# Patient Record
Sex: Male | Born: 1975 | Hispanic: Yes | Marital: Married | Smoking: Smoker, current status unknown
Health system: Southern US, Community
[De-identification: ages and names within clinical notes are randomized; demographics above are authoritative.]

## PROBLEM LIST (undated history)

## (undated) DIAGNOSIS — I209 Angina pectoris, unspecified: Secondary | ICD-10-CM

## (undated) DIAGNOSIS — R0600 Dyspnea, unspecified: Secondary | ICD-10-CM

## (undated) DIAGNOSIS — R569 Unspecified convulsions: Secondary | ICD-10-CM

## (undated) DIAGNOSIS — K219 Gastro-esophageal reflux disease without esophagitis: Secondary | ICD-10-CM

## (undated) HISTORY — PX: OTHER SURGICAL HISTORY: SHX169

---

## 2007-12-18 ENCOUNTER — Emergency Department: Payer: Self-pay | Admitting: Emergency Medicine

## 2015-04-27 ENCOUNTER — Emergency Department
Admission: EM | Admit: 2015-04-27 | Discharge: 2015-04-27 | Disposition: A | Discharge status: Home / Self Care | Payer: Self-pay | Attending: Emergency Medicine | Admitting: Emergency Medicine

## 2015-04-27 ENCOUNTER — Encounter: Payer: Self-pay | Admitting: Emergency Medicine

## 2015-04-27 DIAGNOSIS — Z720 Tobacco use: Secondary | ICD-10-CM | POA: Insufficient documentation

## 2015-04-27 DIAGNOSIS — M545 Low back pain, unspecified: Secondary | ICD-10-CM

## 2015-04-27 MED ORDER — PREDNISONE 20 MG PO TABS: 40.0000 mg | ORAL_TABLET | Freq: Every day | ORAL | Status: AC

## 2015-04-27 MED ORDER — CYCLOBENZAPRINE HCL 5 MG PO TABS: 5.0000 mg | ORAL_TABLET | Freq: Three times a day (TID) | ORAL | Status: AC | PRN

## 2015-04-27 NOTE — ED Notes (Signed)
Pt arrives with complaints of lower back pain, states he had an accident at work 1 month ago and now lower back pain is increasing, pt states he did not seek evaluation at initial injury, pt states increasing pain with activity,denies any numbness or weakness in legs  interpeter Maryjane Hurter at bedside

## 2015-04-27 NOTE — ED Notes (Signed)
Reports injuring back at work last week

## 2015-04-27 NOTE — ED Provider Notes (Signed)
[facility] Emergency Department Provider Note   ____________________________________________  Time seen: 1640  I have reviewed the triage vital signs and the nursing notes.   HISTORY  Chief Complaint Back Pain   History limited by: Language [facility] Interpreter utilized   HPI Trevor Schultz Ashley Jacobs is a 39 y.o. male who presents to the emergency department today with concerns for back pain. Patient states that he has had this pain for roughly 1 month. He states pain started whilst he was at work. He was moving a heavy panel when he torqued his body to one side. He states he started having pain at that time. He is trying to manage at home. Has not seen anybody for this pain since then. He describes it as being located in his lower back. It does radiate bilaterally to the sides. He denies any numbness or change in sensation or strength of his legs. He denies any change in bladder or bowel function. Denies any fevers.   History reviewed. No pertinent past medical history.  There are no active problems to display for this patient.   History reviewed. No pertinent past surgical history.  No current outpatient prescriptions on file.  Allergies Review of patient's allergies indicates no known allergies.  History reviewed. No pertinent family history.  Social History Social History  Substance Use Topics  . Smoking status: Smoker, Current Status Unknown  . Smokeless tobacco: None  . Alcohol Use: No    Review of Systems  Constitutional: Negative for fever. Cardiovascular: Negative for chest pain. Respiratory: Negative for shortness of breath. Gastrointestinal: Negative for abdominal pain, vomiting and diarrhea. Genitourinary: Negative for dysuria. Musculoskeletal: Positive for back pain. Neurological: Negative for headaches, focal weakness or numbness.   10-point ROS otherwise  negative.  ____________________________________________   PHYSICAL EXAM:  VITAL SIGNS: ED Triage Vitals  Enc Vitals Group     BP 04/27/15 1551 143/84 mmHg     Pulse Rate 04/27/15 1551 78     Resp 04/27/15 1551 20     Temp 04/27/15 1551 98 F (36.7 C)     Temp Source 04/27/15 1551 Oral     SpO2 04/27/15 1551 97 %     Weight 04/27/15 1551 198 lb (89.812 kg)     Height 04/27/15 1551 5\' 2"  (1.575 m)     Head Cir --      Peak Flow --      Pain Score 04/27/15 1554 9   Constitutional: Alert and oriented. Well appearing and in no distress. Eyes: Conjunctivae are normal. PERRL. Normal extraocular movements. ENT   Head: Normocephalic and atraumatic.   Nose: No congestion/rhinnorhea.   Mouth/Throat: Mucous membranes are moist.   Neck: No stridor. Hematological/Lymphatic/Immunilogical: No cervical lymphadenopathy. Cardiovascular: Normal rate, regular rhythm.  No murmurs, rubs, or gallops. Respiratory: Normal respiratory effort without tachypnea nor retractions. Breath sounds are clear and equal bilaterally. No wheezes/rales/rhonchi. Gastrointestinal: Soft and nontender. No distention. There is no CVA tenderness. Genitourinary: Deferred Musculoskeletal: Mildly tender to palpation along the lower back. Straight leg test positive bilaterally. Neurologic:  Normal speech and language. No gross focal neurologic deficits are appreciated. Speech is normal.  Skin:  Skin is warm, dry and intact. No rash noted. Psychiatric: Mood and affect are normal. Speech and behavior are normal. Patient exhibits appropriate insight and judgment.  ____________________________________________    LABS (pertinent positives/negatives)  None  ____________________________________________   EKG  None  ____________________________________________    RADIOLOGY  None  ____________________________________________  PROCEDURES  Procedure(s) performed: None  Critical Care performed:  No  ____________________________________________   INITIAL IMPRESSION / ASSESSMENT AND PLAN / ED COURSE  Pertinent labs & imaging results that were available during my care of the patient were reviewed by me and considered in my medical decision making (see chart for details).  Patient presents to the emergency department today with concerns for low back pain. On exam patient did have very mild tenderness to palpation of the lumbar spine. Patient did have positive straight leg test. No bladder or bowel changes. No other concerning findings for cord compression. At this point I do not feel that patient needs or warrants emergent radiographic imaging. Will plan on treating conservatively for low back pain.  ____________________________________________   FINAL CLINICAL IMPRESSION(S) / ED DIAGNOSES  Final diagnoses:  Midline low back pain without sciatica     Phineas Semen, MD 04/27/15 1711

## 2015-04-27 NOTE — Discharge Instructions (Signed)
Please seek medical attention for any high fevers, chest pain, shortness of breath, change in behavior, persistent vomiting, bloody stool or any other new or concerning symptoms.   Ejercicios para la espalda (Back Exercises) Estos ejercicios ayudan a tratar y prevenir lesiones en la espalda. El objetivo es aumentar la fuerza de los msculos abdominales y dorsales y la flexibilidad de la espalda. Debe comenzar con estos ejercicios cuando ya no tenga dolor. Los ejercicios para la espalda incluyen:  Inclinacin de la pelvis - Recustese sobre la espalda con las rodillas flexionadas. Incline la pelvis hasta que la parte inferior de la espalda se apoye en el piso. Mantenga esta posicin durante 5 a 10 segundos y repita entre 5 y 34 veces.  Rodilla al pecho - Empuje primero una rodilla contra el pecho y Harvey 20 a 30 segundos; repita con la otra rodilla y luego con ambas a la vez. Esto puede realizarlo con la otra pierna extendida o flexionada, del modo en que se sienta ms cmodo.  Abdominales o despegar el cccix del suelo empleando la musculatura abdominal - Shepherd 90 grados. Comience inclinando la pelvis y realice un ejercicio abdominal lento y parcial, elevando el tronco slo entre 61 y 48 grados del suelo. Emplee al [employer] 2 y 3 segundos para cada abdominal. No realice los abdominales con las rodillas extendidas. Si le resulta difcil realizar abdominales parciales, simplemente haga lo que se explic anteriormente, pero slo contraiga los msculos abdominales y Civil engineer, contracting tal como se le ha indicado.  Inclinacin de la cadera - Recustese sobre la espalda con las rodillas flexionadas a 90 grados. Empjese con los pies y los hombros mientras eleva la cadera un par de centmetros del suelo, [place] durante 10 segundos y repita entre 5 y 10 veces.  Arcos dorsales - Acustese sobre el [place] e impulse el tronco hacia atrs sobre los codos flexionados. Presione lentamente  con las manos, formando un arco con la zona inferior de la espalda. Repita entre 3 y 5 veces. Al realizar las repeticiones, luego de un tiempo disminuirn la rigidez y las [place].  Elevacin de los hombros - Acustese hacia abajo con los brazos a los lados del cuerpo. Braggs y Photographer torso contra el suelo mientras eleva lentamente la cabeza y los hombros del suelo. No exagere con los ejercicios, especialmente en el comienzo. Los ejercicios pueden causar alguna molestia leve en la espalda durante algunos minutos; sin embargo, si el dolor es muy intenso, o dura ms de 15 minutos, no siga con la actividad fsica hasta que consulte al profesional que lo asiste. Los problemas en la espalda mejoran de [place] lenta con esta terapia.  Consulte al profesional para que lo ayude a planificar un programa de ejercicios adecuado para su espalda. Document Released: 08/21/2005 Document Revised: 11/13/2011 [facility] Patient Information 2015 [place]. This information is not intended to replace advice given to you by your health care provider. Make sure you discuss any questions you have with your health care provider.  Prevencin de las lesiones en la espalda (Back Injury Prevention) Las lesiones en la espalda pueden ser muy dolorosas y son difciles de curar. Despus de tener una lesin en la espalda es probable que sufra otra en el futuro. Es importante aprender a Air traffic controller o a Location manager a Engineer, maintenance (IT). Los siguientes consejos pueden ayudar a prevenir una lesin de espalda.  APTITUD FSICA   Practique actividad fsica con frecuencia y trate de lograr un buen  tono en los msculos abdominales. Los msculos del abdomen nos proporcionan el soporte necesario para la espalda.  Haga ejercicios aerbicos (caminar, trotar, andar en bicicleta, nadar) con frecuencia.  Haga ejercicios que aumenten el equilibrio y la fuerza (tai chi, yoga) con frecuencia. Esto puede disminuir el riesgo de  caerse y Building surveyor espalda.  Elongue antes y despus de hacer [place].  Mantenga un peso saludable. Cuanto ms pese, ms tensin se pone en la espalda. Por cada libra de [place], es 10 veces mayor la cantidad de presin que se coloca en la parte posterior. DIETA   Consulte con su mdico la cantidad de calcio y vitamina D que necesita por da Estos nutrientes ayudan a prevenir el debilitamiento de los huesos (osteoporosis). La osteoporosis puede quebrar los huesos (fractura) que hacen doler la espalda.  Agregue una buena fuente de calcio en la dieta, como productos lcteos, vegetales de hojas verdes y productos con calcio agregado (fortificados).  Agregue una buena fuente de vitamina D en su dieta, como la [place] y alimentos que estn fortificados con vitamina D.  Consulte por un suplemento nutricional o un multivitamnico, si es necesario.  Si fuma, abandone el hbito. POSTURA   Sintese y pngase de pie en posicin recta. Evite inclinarse hacia adelante al sentarse o encorvarse mientras est de pie.  Elija sillas con buen apoyo para la espalda (lumbar).  Si trabaja en un escritorio, sintese cerca del mismo, de modo que no deba inclinarse hacia adelante. Mantenga el [employer]. El cuello debe estar hacia atrs y los codos doblados en ngulo recto. Los brazos deben formar la letra "L".  Sintese derecho y cerca del volante cuando conduzca su automvil. Coloque un soporte lumbar en el asiento de su automvil, si lo necesita.  Evite permanecer sentado o de pie en la misma posicin por [employer]. Descanse, levntese, estrese y camine al menos una vez cada hora. Tome descansos si conduce el automvil por [employer].  Duerma sobre un costado con las rodillas ligeramente dobladas o sobre la espalda con una almohada debajo de las rodillas. No duerma sobre el abdomen. LEVANTAR, DOBLARSE Y ALCANZAR   Evite levantar objetos pesados, especialmente si debe repetir los movimientos.  Si tiene que levantar un objeto pesado:  Haga elongaciones antes de Lexicographer un objeto.  Trabaje lentamente.  Descanse entre uno y otro esfuerzo.  Use carretas y carretillas para mover objetos siempre que pueda.  Haga varios viajes pequeos en vez de llevar una carga pesada.  Pida ayuda cuando la necesite.  Pida ayuda al mover objetos grandes y que sean difciles de [place].  Siga estos pasos al levantar objetos:  Prese con los pies separados a la misma distancia que el ancho de los hombros.  Mantngase lo ms cerca que pueda del [place]. No intente levantar objetos pesados que se encuentren lejos de su cuerpo.  Use los mangos o las correas de elevacin, si estn disponibles.  Doble las rodillas. Pngase de cuclillas, pero mantenga los [employer].  Mantenga los hombros hacia atrs, el mentn pegado, y la espalda recta.  Levante el objeto lentamente, tensando los msculos de las piernas, el abdomen y las nalgas. Mantenga el objeto lo ms cerca del centro de su cuerpo como sea posible.  Cuando baje la carga, use las [employer], pero al revs.  NO:  Levante el objeto por arriba de la cintura.  Gire la cintura mientras levanta o sostiene una carga pesada. Si necesita dar vuelta mueva sus  pies, no su cintura.  Inclnese hacia adelante sin flexionar las rodillas.  Evite levantar los objetos por arriba de su cabeza, por encima de una mesa o aquellos objetos que se encuentren sobre una superficie elevada. OTROS CONSEJOS   Evite pisar [employer] Peps[employer] mojados y [place] las aceras libres de hielo para evitar cadas.  No duerma sobre un colchn muy blando o muy duro.  Mantenga a su alcance los artculos que utiliza con frecuencia.  Coloque los objetos pesados en estantes a nivel de la cintura y los objetos ms livianos en estantes ms altos o ms bajos.  Encuentre formas para reducir Dealer, como el ejercicio, los masajes o las tcnicas de Systems developer. El  estrs puede [employer]. Los msculos tensionados son ms vulnerables a las lesiones.  Busque tratamientos para la depresin o la ansiedad si lo necesita. Estos trastornos pueden aumentar el riesgo de Hydrologist de [place]. SOLICITE ATENCIN MDICA SI:   Se lesiona la espalda.  Tiene preguntas sobre la dieta, el ejercicio u otras formas de prevenir las lesiones en la espalda. ASEGRESE DE QUE:   Comprende estas instrucciones.  Controlar su enfermedad.  Solicitar ayuda de inmediato si no mejora o si empeora. Document Released: 08/21/2005 Document Revised: 11/13/2011 [facility] Patient Information 2015 [place]. This information is not intended to replace advice given to you by your health care provider. Make sure you discuss any questions you have with your health care provider.

## 2016-07-06 DIAGNOSIS — K210 Gastro-esophageal reflux disease with esophagitis: Secondary | ICD-10-CM | POA: Insufficient documentation

## 2016-07-06 DIAGNOSIS — F17200 Nicotine dependence, unspecified, uncomplicated: Secondary | ICD-10-CM | POA: Insufficient documentation

## 2016-07-06 NOTE — ED Triage Notes (Signed)
Pt in with co chest pain for a few days no hx of the same. No recent illness or injury.

## 2016-07-07 ENCOUNTER — Emergency Department
Admission: EM | Admit: 2016-07-07 | Discharge: 2016-07-07 | Disposition: A | Discharge status: Home / Self Care | Payer: Self-pay | Attending: Emergency Medicine | Admitting: Emergency Medicine

## 2016-07-07 ENCOUNTER — Emergency Department: Payer: Self-pay

## 2016-07-07 DIAGNOSIS — K2100 Gastro-esophageal reflux disease with esophagitis, without bleeding: Secondary | ICD-10-CM

## 2016-07-07 DIAGNOSIS — R1011 Right upper quadrant pain: Secondary | ICD-10-CM

## 2016-07-07 LAB — COMPREHENSIVE METABOLIC PANEL
ALBUMIN: 4.2 g/dL (ref 3.5–5.0)
ALT: 47 U/L (ref 17–63)
AST: 69 U/L — AB (ref 15–41)
Alkaline Phosphatase: 75 U/L (ref 38–126)
Anion gap: 7 (ref 5–15)
BUN: 16 mg/dL (ref 6–20)
CHLORIDE: 104 mmol/L (ref 101–111)
CO2: 22 mmol/L (ref 22–32)
CREATININE: 0.81 mg/dL (ref 0.61–1.24)
Calcium: 8.8 mg/dL — ABNORMAL LOW (ref 8.9–10.3)
GFR calc Af Amer: >60 mL/min (ref >60)
GFR calc non Af Amer: >60 mL/min (ref >60)
Glucose, Bld: 121 mg/dL — ABNORMAL HIGH (ref 65–99)
POTASSIUM: 5.4 mmol/L — AB (ref 3.5–5.1)
SODIUM: 133 mmol/L — AB (ref 135–145)
Total Bilirubin: 1.6 mg/dL — ABNORMAL HIGH (ref 0.3–1.2)
Total Protein: 6.2 g/dL — ABNORMAL LOW (ref 6.5–8.1)

## 2016-07-07 LAB — CBC
HCT: 40.3 % (ref 40.0–52.0)
Hemoglobin: 13 g/dL (ref 13.0–18.0)
MCH: 27.9 pg (ref 26.0–34.0)
MCHC: 32.3 g/dL (ref 32.0–36.0)
MCV: 86.3 fL (ref 80.0–100.0)
PLATELETS: 317 10*3/uL (ref 150–440)
RBC: 4.67 MIL/uL (ref 4.40–5.90)
RDW: 14.6 % — AB (ref 11.5–14.5)
WBC: 9.7 10*3/uL (ref 3.8–10.6)

## 2016-07-07 LAB — TROPONIN I: Troponin I: <0.03 ng/mL (ref <0.03)

## 2016-07-07 MED ORDER — PANTOPRAZOLE SODIUM 40 MG PO TBEC
40.0000 mg | DELAYED_RELEASE_TABLET | Freq: Once | ORAL | Status: AC
  Administered 2016-07-07: 40 mg via ORAL
  Filled 2016-07-07: qty 1

## 2016-07-07 MED ORDER — PANTOPRAZOLE SODIUM 40 MG PO TBEC: 40.0000 mg | DELAYED_RELEASE_TABLET | Freq: Every day | ORAL | 0 refills | Status: AC

## 2016-07-07 NOTE — ED Notes (Signed)
D/C done with interpreter Steffany [phone]#[id number].

## 2016-07-07 NOTE — ED Provider Notes (Signed)
[facility] Emergency Department Provider Note    First MD Initiated Contact with Patient 07/07/16 0130     (approximate)  I have reviewed the triage vital signs and the nursing notes.   HISTORY  Chief Complaint Chest Pain    HPI Lavonda JumboMarvin Flores Reyes is a 40 y.o. male presents with complaint of epigastric/chest pain 2 years intermittently. Patient describes the pain as burning. Patient denies any dyspnea no nausea or vomiting   Past medical history None There are no active problems to display for this patient.   Past Surgical history None  Prior to Admission medications   Not on File    Allergies No known drug allergies No family history on file.  Social History Social History  Substance Use Topics  . Smoking status: Smoker, Current Status Unknown  . Smokeless tobacco: Not on file  . Alcohol use No    Review of Systems Constitutional: No fever/chills Eyes: No visual changes. ENT: No sore throat. Cardiovascular: Positive for chest pain. Respiratory: Denies shortness of breath. Gastrointestinal: No abdominal pain.  No nausea, no vomiting.  No diarrhea.  No constipation. Genitourinary: Negative for dysuria. Musculoskeletal: Negative for back pain. Skin: Negative for rash. Neurological: Negative for headaches, focal weakness or numbness.  10-point ROS otherwise negative.  ____________________________________________   PHYSICAL EXAM:  VITAL SIGNS: ED Triage Vitals  Enc Vitals Group     BP 07/06/16 2328 139/89     Pulse Rate 07/06/16 2328 87     Resp 07/06/16 2328 18     Temp 07/06/16 2328 98 F (36.7 C)     Temp Source 07/06/16 2328 Oral     SpO2 07/06/16 2328 96 %     Weight 07/06/16 2325 198 lb (89.8 kg)     Height 07/06/16 2325 5\' 6"  (1.676 m)     Head Circumference --      Peak Flow --      Pain Score 07/06/16 2326 9     Pain Loc --      Pain Edu? --      Excl. in GC? --     Constitutional: Alert and oriented.  Well appearing and in no acute distress. Eyes: Conjunctivae are normal. PERRL. EOMI. Head: Atraumatic. Mouth/Throat: Mucous membranes are moist.  Oropharynx non-erythematous. Neck: No stridor.  No meningeal signs.  No cervical spine tenderness to palpation. Cardiovascular: Normal rate, regular rhythm. Good peripheral circulation. Grossly normal heart sounds. Respiratory: Normal respiratory effort.  No retractions. Lungs CTAB. Gastrointestinal: Soft and nontender. No distention.  Musculoskeletal: No lower extremity tenderness nor edema. No gross deformities of extremities. Neurologic:  Normal speech and language. No gross focal neurologic deficits are appreciated.  Skin:  Skin is warm, dry and intact. No rash noted. Psychiatric: Mood and affect are normal. Speech and behavior are normal.  ____________________________________________   LABS (all labs ordered are listed, but only abnormal results are displayed)  Labs Reviewed  CBC - Abnormal; Notable for the following:       Result Value   RDW 14.6 (*)    All other components within normal limits  COMPREHENSIVE METABOLIC PANEL - Abnormal; Notable for the following:    Sodium 133 (*)    Potassium 5.4 (*)    Glucose, Bld 121 (*)    Calcium 8.8 (*)    Total Protein 6.2 (*)    AST 69 (*)    Total Bilirubin 1.6 (*)    All other components within normal limits  TROPONIN  I   ____________________________________________  EKG  ED ECG REPORT I, [place] N Cristin Penaflor, the attending physician, personally viewed and interpreted this ECG.   Date: 07/07/2016  EKG Time: 11:25 PM  Rate: 76  Rhythm: Normal sinus rhythm  Axis: Normal  Intervals: Normal  ST&T Change: None  ____________________________________________  RADIOLOGY I, [place] N Nyala Kirchner, personally viewed and evaluated these images (plain radiographs) as part of my medical decision making, as well as reviewing the written report by the radiologist.  Dg Chest 2 View  Addendum  Date: 07/07/2016   ADDENDUM REPORT: 07/07/2016 03:18 ADDENDUM: The prominent calcified density overlying the left lung base most likely reflects a large calcified granuloma. Alternatively, if it resides within the left ventricle, it could reflect calcification near the mitral annulus. Regardless, it has a benign appearance. Electronically Signed   By: Roanna RaiderJeffery  Chang M.D.   On: 07/07/2016 03:18   Result Date: 07/07/2016 CLINICAL DATA:  Acute onset of generalized chest pain. Initial encounter. EXAM: CHEST  2 VIEW COMPARISON:  None. FINDINGS: The lungs are well-aerated and clear. There is no evidence of focal opacification, pleural effusion or pneumothorax. The heart is borderline enlarged. No acute osseous abnormalities are seen. IMPRESSION: Borderline cardiomegaly.  Lungs remain grossly clear. Electronically Signed: By: Roanna RaiderJeffery  Chang M.D. On: 07/07/2016 02:49   [place] Abdomen Limited Ruq  Result Date: 07/07/2016 CLINICAL DATA:  Right upper quadrant pain for several days. EXAM: US ABDOMEN LIMITED - RIGHT UPPER QUADRANT COMPARISON:  None. FINDINGS: Gallbladder: No gallstones or wall thickening visualized. No sonographic Murphy sign noted by sonographer. Common bile duct: Diameter: 4 mm Liver: No focal lesion identified. Within normal limits in parenchymal echogenicity. IMPRESSION: Normal liver, gallbladder and bile ducts Electronically Signed   By: Ellery Plunkaniel R Mitchell M.D.   On: 07/07/2016 03:19     Procedures     INITIAL IMPRESSION / ASSESSMENT AND PLAN / ED COURSE  Pertinent labs & imaging results that were available during my care of the patient were reviewed by me and considered in my medical decision making (see chart for details).     Clinical Course    ____________________________________________  FINAL CLINICAL IMPRESSION(S) / ED DIAGNOSES  Final diagnoses:  RUQ pain     MEDICATIONS GIVEN DURING THIS VISIT:  Medications  pantoprazole (PROTONIX) EC tablet 40 mg (not administered)      NEW OUTPATIENT MEDICATIONS STARTED DURING THIS VISIT:  New Prescriptions   No medications on file    Modified Medications   No medications on file    Discontinued Medications   No medications on file     Note:  This document was prepared using Dragon voice recognition software and may include unintentional dictation errors.    Darci Currentandolph N Kenry Daubert, MD 07/07/16 [phone]

## 2016-07-07 NOTE — ED Notes (Signed)
Pt discharged to home.  Family member driving.  Discharge instructions reviewed.  Verbalized understanding.  No questions or concerns at this time.  Teach back verified.  Pt in NAD.  No items left in ED.   

## 2016-07-07 NOTE — ED Notes (Signed)
Interpreter Jasmine DecemberSharon [phone]#[id number] used for interpretation and assessment.

## 2016-07-31 ENCOUNTER — Observation Stay
Admission: EM | Admit: 2016-07-31 | Discharge: 2016-08-02 | Disposition: A | Discharge status: Home / Self Care | Payer: Self-pay | Attending: Internal Medicine | Admitting: Internal Medicine

## 2016-07-31 DIAGNOSIS — Z7982 Long term (current) use of aspirin: Secondary | ICD-10-CM | POA: Insufficient documentation

## 2016-07-31 DIAGNOSIS — R0602 Shortness of breath: Secondary | ICD-10-CM | POA: Insufficient documentation

## 2016-07-31 DIAGNOSIS — R4701 Aphasia: Secondary | ICD-10-CM | POA: Insufficient documentation

## 2016-07-31 DIAGNOSIS — R569 Unspecified convulsions: Secondary | ICD-10-CM

## 2016-07-31 DIAGNOSIS — J984 Other disorders of lung: Secondary | ICD-10-CM | POA: Insufficient documentation

## 2016-07-31 DIAGNOSIS — K219 Gastro-esophageal reflux disease without esophagitis: Secondary | ICD-10-CM | POA: Insufficient documentation

## 2016-07-31 DIAGNOSIS — R079 Chest pain, unspecified: Principal | ICD-10-CM

## 2016-07-31 DIAGNOSIS — E785 Hyperlipidemia, unspecified: Secondary | ICD-10-CM | POA: Insufficient documentation

## 2016-07-31 DIAGNOSIS — F17200 Nicotine dependence, unspecified, uncomplicated: Secondary | ICD-10-CM | POA: Insufficient documentation

## 2016-07-31 DIAGNOSIS — I517 Cardiomegaly: Secondary | ICD-10-CM | POA: Insufficient documentation

## 2016-07-31 HISTORY — DX: Unspecified convulsions: R56.9

## 2016-07-31 HISTORY — DX: Angina pectoris, unspecified: I20.9

## 2016-07-31 HISTORY — DX: Gastro-esophageal reflux disease without esophagitis: K21.9

## 2016-07-31 HISTORY — DX: Dyspnea, unspecified: R06.00

## 2016-08-01 ENCOUNTER — Inpatient Hospital Stay: Admit: 2016-08-01 | Payer: Self-pay

## 2016-08-01 ENCOUNTER — Observation Stay: Payer: Self-pay

## 2016-08-01 ENCOUNTER — Emergency Department: Payer: Self-pay

## 2016-08-01 ENCOUNTER — Encounter: Payer: Self-pay | Admitting: Occupational Medicine

## 2016-08-01 DIAGNOSIS — R55 Syncope and collapse: Secondary | ICD-10-CM

## 2016-08-01 DIAGNOSIS — R079 Chest pain, unspecified: Secondary | ICD-10-CM | POA: Diagnosis present

## 2016-08-01 DIAGNOSIS — R569 Unspecified convulsions: Secondary | ICD-10-CM

## 2016-08-01 LAB — CBC
HEMATOCRIT: 44.9 % (ref 40.0–52.0)
HEMATOCRIT: 44.9 % (ref 40.0–52.0)
HEMOGLOBIN: 15.4 g/dL (ref 13.0–18.0)
HEMOGLOBIN: 15.6 g/dL (ref 13.0–18.0)
MCH: 29.2 pg (ref 26.0–34.0)
MCH: 28.6 pg (ref 26.0–34.0)
MCHC: 34.3 g/dL (ref 32.0–36.0)
MCHC: 34.8 g/dL (ref 32.0–36.0)
MCV: 84 fL (ref 80.0–100.0)
MCV: 83.3 fL (ref 80.0–100.0)
Platelets: 261 10*3/uL (ref 150–440)
Platelets: 250 10*3/uL (ref 150–440)
RBC: 5.35 MIL/uL (ref 4.40–5.90)
RBC: 5.39 MIL/uL (ref 4.40–5.90)
RDW: 14.1 % (ref 11.5–14.5)
RDW: 14.2 % (ref 11.5–14.5)
WBC: 10.3 10*3/uL (ref 3.8–10.6)
WBC: 8.3 10*3/uL (ref 3.8–10.6)

## 2016-08-01 LAB — URINE DRUG SCREEN, QUALITATIVE (ARMC ONLY)
AMPHETAMINES, UR SCREEN: NOT DETECTED
Barbiturates, Ur Screen: NOT DETECTED
Benzodiazepine, Ur Scrn: NOT DETECTED
COCAINE METABOLITE, UR ~~LOC~~: NOT DETECTED
Cannabinoid 50 Ng, Ur ~~LOC~~: NOT DETECTED
MDMA (ECSTASY) UR SCREEN: NOT DETECTED
METHADONE SCREEN, URINE: NOT DETECTED
OPIATE, UR SCREEN: NOT DETECTED
Phencyclidine (PCP) Ur S: NOT DETECTED
Tricyclic, Ur Screen: NOT DETECTED

## 2016-08-01 LAB — COMPREHENSIVE METABOLIC PANEL
ALBUMIN: 4.3 g/dL (ref 3.5–5.0)
ALK PHOS: 75 U/L (ref 38–126)
ALT: 35 U/L (ref 17–63)
ANION GAP: 9 (ref 5–15)
AST: 30 U/L (ref 15–41)
BUN: 11 mg/dL (ref 6–20)
CALCIUM: 9.4 mg/dL (ref 8.9–10.3)
CO2: 23 mmol/L (ref 22–32)
Chloride: 106 mmol/L (ref 101–111)
Creatinine, Ser: 0.61 mg/dL (ref 0.61–1.24)
GFR calc Af Amer: >60 mL/min (ref >60)
GFR calc non Af Amer: >60 mL/min (ref >60)
GLUCOSE: 106 mg/dL — AB (ref 65–99)
POTASSIUM: 3.9 mmol/L (ref 3.5–5.1)
SODIUM: 138 mmol/L (ref 135–145)
TOTAL PROTEIN: 7.6 g/dL (ref 6.5–8.1)

## 2016-08-01 LAB — URINALYSIS COMPLETE WITH MICROSCOPIC (ARMC ONLY)
BILIRUBIN URINE: NEGATIVE
Bacteria, UA: NONE SEEN
GLUCOSE, UA: NEGATIVE mg/dL
Ketones, ur: NEGATIVE mg/dL
LEUKOCYTES UA: NEGATIVE
Nitrite: NEGATIVE
Protein, ur: NEGATIVE mg/dL
Specific Gravity, Urine: 1.009 (ref 1.005–1.030)
pH: 6 (ref 5.0–8.0)

## 2016-08-01 LAB — BASIC METABOLIC PANEL
Anion gap: 8 (ref 5–15)
BUN: 10 mg/dL (ref 6–20)
CHLORIDE: 105 mmol/L (ref 101–111)
CO2: 25 mmol/L (ref 22–32)
CREATININE: 0.55 mg/dL — AB (ref 0.61–1.24)
Calcium: 9.2 mg/dL (ref 8.9–10.3)
GFR calc Af Amer: >60 mL/min (ref >60)
GFR calc non Af Amer: >60 mL/min (ref >60)
Glucose, Bld: 95 mg/dL (ref 65–99)
Potassium: 3.8 mmol/L (ref 3.5–5.1)
SODIUM: 138 mmol/L (ref 135–145)

## 2016-08-01 LAB — LIPID PANEL
CHOL/HDL RATIO: 8.5 ratio
Cholesterol: 229 mg/dL — ABNORMAL HIGH (ref 0–200)
HDL: 27 mg/dL — AB (ref >40)
LDL Cholesterol: UNDETERMINED mg/dL (ref 0–99)
Triglycerides: 459 mg/dL — ABNORMAL HIGH (ref <150)
VLDL: UNDETERMINED mg/dL (ref 0–40)

## 2016-08-01 LAB — ETHANOL: Alcohol, Ethyl (B): <5 mg/dL (ref <5)

## 2016-08-01 LAB — TROPONIN I: Troponin I: <0.03 ng/mL (ref <0.03)

## 2016-08-01 LAB — LIPASE, BLOOD: LIPASE: 26 U/L (ref 11–51)

## 2016-08-01 MED ORDER — ASPIRIN 300 MG RE SUPP
300.0000 mg | RECTAL | Status: AC
  Filled 2016-08-01: qty 1

## 2016-08-01 MED ORDER — PANTOPRAZOLE SODIUM 40 MG PO TBEC
40.0000 mg | DELAYED_RELEASE_TABLET | Freq: Every day | ORAL | Status: DC
Start: 2016-08-02 — End: 2016-08-02
  Administered 2016-08-02: 40 mg via ORAL
  Filled 2016-08-01: qty 1

## 2016-08-01 MED ORDER — ONDANSETRON HCL 4 MG/2ML IJ SOLN: 4.0000 mg | Freq: Four times a day (QID) | INTRAMUSCULAR | Status: DC | PRN

## 2016-08-01 MED ORDER — ASPIRIN EC 81 MG PO TBEC
81.0000 mg | DELAYED_RELEASE_TABLET | Freq: Every day | ORAL | Status: DC
  Administered 2016-08-02: 81 mg via ORAL
  Filled 2016-08-01: qty 1

## 2016-08-01 MED ORDER — ACETAMINOPHEN 325 MG PO TABS: 650.0000 mg | ORAL_TABLET | ORAL | Status: DC | PRN

## 2016-08-01 MED ORDER — IOPAMIDOL (ISOVUE-370) INJECTION 76%
75.0000 mL | Freq: Once | INTRAVENOUS | Status: AC | PRN
  Administered 2016-08-01: 75 mL via INTRAVENOUS

## 2016-08-01 MED ORDER — NITROGLYCERIN 0.4 MG SL SUBL: 0.4000 mg | SUBLINGUAL_TABLET | SUBLINGUAL | Status: DC | PRN

## 2016-08-01 MED ORDER — GADOBENATE DIMEGLUMINE 529 MG/ML IV SOLN
20.0000 mL | Freq: Once | INTRAVENOUS | Status: AC | PRN
  Administered 2016-08-01: 18 mL via INTRAVENOUS

## 2016-08-01 MED ORDER — FENOFIBRATE 160 MG PO TABS
160.0000 mg | ORAL_TABLET | Freq: Every day | ORAL | Status: DC
  Administered 2016-08-01: 160 mg via ORAL
  Filled 2016-08-01: qty 1

## 2016-08-01 MED ORDER — ASPIRIN 81 MG PO CHEW
324.0000 mg | CHEWABLE_TABLET | ORAL | Status: AC
  Administered 2016-08-01: 324 mg via ORAL
  Filled 2016-08-01: qty 4

## 2016-08-01 MED ORDER — ENOXAPARIN SODIUM 40 MG/0.4ML ~~LOC~~ SOLN
40.0000 mg | SUBCUTANEOUS | Status: DC
  Administered 2016-08-01: 40 mg via SUBCUTANEOUS
  Filled 2016-08-01: qty 0.4

## 2016-08-01 NOTE — ED Notes (Signed)
ED Provider at bedside. 

## 2016-08-01 NOTE — Progress Notes (Signed)
Patient seen and examined this morning with Spanish interpreter. Case discussed with Dr. Thad Rangereynolds and Dr. Gwen PoundsKowalski.  Continue cardiac and neurological workup for congestive heart failure/CAD as well as seizures.

## 2016-08-01 NOTE — ED Notes (Signed)
SOC in progress at this time.  

## 2016-08-01 NOTE — ED Triage Notes (Signed)
Pt presents from home with CP left upper chest stabbing pain 9/10 hurts to breathe  Pain on palpation and states SHOB even and unlabored . Pt reports LOC from fainting from the pain. Not the first time its happen. Pt states here before given Protonix. Pt only speaks spanish video interpreter used.

## 2016-08-01 NOTE — ED Provider Notes (Signed)
[facility] Emergency Department Provider Note  ____________________________________________  Time seen: Approximately 1:13 AM  I have reviewed the triage vital signs and the nursing notes.   HISTORY  Chief Complaint Chest Pain (left side); Loss of Consciousness (from the cp); and Shortness of Breath (hurts to breathe)  Level 5 caveat:  Portions of the history and physical were unable to be obtained due to aphasia   HPI Trevor Schultz is a 40 y.o. male with a history of GERD who presents for evaluation of a syncopal episode in the setting of chest pain. History is gathered from patient's wife as patient is unable to speak. According to the wife patient has had left-sided sharp intermittent chest pain on a daily basis for many months. He has had multiple episodes including 3 in the last 2 weeks where the pain gets so severe that he has what they describe an episode of loss of consciousness with a generalized tonic-clonic movement and foaming through the mouth. These episodes are usually followed by a few hours were patient is unable to speak. Patient has not have any urinary or bowel incontinence or tongue trauma with these episodes. No history of seizures or family history of ischemic heart disease. Patient is not a smoker, does not drink alcohol or use drugs. Patient follows commands and establishes eye contact but is unable to speak. When I ask if he has pain, he shakes his head and points to his left chest.  Past Medical History:  Diagnosis Date  . GERD (gastroesophageal reflux disease)     There are no active problems to display for this patient.   History reviewed. No pertinent surgical history.  Prior to Admission medications   Medication Sig Start Date End Date Taking? Authorizing Provider  pantoprazole (PROTONIX) 40 MG tablet Take 1 tablet (40 mg total) by mouth daily. 07/07/16 07/07/17 Yes Darci Current, MD    Allergies Patient has no known  allergies.  No family history on file.  Social History Social History  Substance Use Topics  . Smoking status: Smoker, Current Status Unknown  . Smokeless tobacco: Never Used  . Alcohol use No    Review of Systems  Constitutional: Negative for fever. + LOC Eyes: Negative for visual changes. ENT: Negative for sore throat. Neck: No neck pain  Cardiovascular: + chest pain. Respiratory: Negative for shortness of breath. Gastrointestinal: Negative for abdominal pain, vomiting or diarrhea. Genitourinary: Negative for dysuria. Musculoskeletal: Negative for back pain. Skin: Negative for rash. Neurological: Negative for headaches, weakness or numbness. + aphasia Psych: No SI or HI  ____________________________________________   PHYSICAL EXAM:  VITAL SIGNS: ED Triage Vitals  Enc Vitals Group     BP 08/01/16 0000 137/88     Pulse Rate 08/01/16 0000 73     Resp 08/01/16 0000 14     Temp 08/01/16 0000 97.8 F (36.6 C)     Temp Source 08/01/16 0000 Oral     SpO2 08/01/16 0000 95 %     Weight 08/01/16 0000 196 lb (88.9 kg)     Height 08/01/16 0000 5\' 5"  (1.651 m)     Head Circumference --      Peak Flow --      Pain Score 08/01/16 0001 9     Pain Loc --      Pain Edu? --      Excl. in GC? --     Constitutional: Awake, will look at me and follow commands, unable to  speak HEENT:      Head: Normocephalic and atraumatic.         Eyes: Conjunctivae are normal. Sclera is non-icteric. EOMI. PERRL      Mouth/Throat: Mucous membranes are moist.       Neck: Supple with no signs of meningismus. Cardiovascular: Regular rate and rhythm. No murmurs, gallops, or rubs. 2+ symmetrical distal pulses are present in all extremities. No JVD. Respiratory: Normal respiratory effort. Lungs are clear to auscultation bilaterally. No wheezes, crackles, or rhonchi.  Gastrointestinal: Soft, non tender, and non distended with positive bowel sounds. No rebound or guarding. Genitourinary: No CVA  tenderness. Musculoskeletal: Nontender with normal range of motion in all extremities. No edema, cyanosis, or erythema of extremities. Neurologic:Face is symmetric. EOMI, Moving all extremities. Aphasia Skin: Skin is warm, dry and intact. No rash noted.   ____________________________________________   LABS (all labs ordered are listed, but only abnormal results are displayed)  Labs Reviewed  COMPREHENSIVE METABOLIC PANEL - Abnormal; Notable for the following:       Result Value   Glucose, Bld 106 (*)    Total Bilirubin <0.1 (*)    All other components within normal limits  URINALYSIS COMPLETEWITH MICROSCOPIC ([facility] ONLY) - Abnormal; Notable for the following:    Color, Urine YELLOW (*)    APPearance CLEAR (*)    Hgb urine dipstick 1+ (*)    Squamous Epithelial / LPF 0-5 (*)    All other components within normal limits  CBC  LIPASE, BLOOD  TROPONIN I  URINE DRUG SCREEN, QUALITATIVE ([facility] ONLY)  ETHANOL   ____________________________________________  EKG  ED ECG REPORT I, Nita Sicklearolina Davian Hanshaw, the attending physician, personally viewed and interpreted this ECG. Normal sinus rhythm, rate of 84, normal intervals, normal axis, ST elevations on V1 and V2 with no reciprocal changes consistent with early repol. Unchanged from prior.  ____________________________________________  RADIOLOGY  CXR: 1. Low lung volumes without acute infiltrate 2. Mild cardiomegaly without overt failure  CT head: Normal head CT.  CT chest: 1. No CT evidence for acute aortic dissection. 2. Mild cardiomegaly. Coarse calcification appears to localize to the left ventricular myocardium, this could be related to previous myocardial infarct or myocarditis. There does not appear to be aneurysmal dilatation of the left ventricle. ____________________________________________   PROCEDURES  Procedure(s) performed: None Procedures Critical Care performed:   None ____________________________________________   INITIAL IMPRESSION / ASSESSMENT AND PLAN / ED COURSE  Syncope versus seizure in a 40 year old with no prior past medical history. Patient is now a phasic. [facility] episode in 2 weeks[facility] We'll get a CT of the chest to rule out dissection. CT of head. EKG with no evidence of ischemia. We'll cycle troponin.  Clinical Course    Patient evaluated by tele neurology Dr. Baxter KailVilla who recommended admission to the hospital for EEG and MRI of the brain. No recommendations for a loading patient with antiepileptics at this time. CT head and dissection with no acute findings. Blood work, urinalysis, drug screen all within normal limits including troponin which is negative. Patient be admitted to the hospitalist service.  Pertinent labs & imaging results that were available during my care of the patient were reviewed by me and considered in my medical decision making (see chart for details).    ____________________________________________   FINAL CLINICAL IMPRESSION(S) / ED DIAGNOSES  Final diagnoses:  Seizure (HCC)  Chest pain, unspecified type      NEW MEDICATIONS STARTED DURING THIS VISIT:  New Prescriptions  No medications on file     Note:  This document was prepared using Dragon voice recognition software and may include unintentional dictation errors.    Nita Sicklearolina Erine Phenix, MD 08/01/16 [phone]

## 2016-08-01 NOTE — ED Notes (Signed)
Patient transported to CT 

## 2016-08-01 NOTE — H&P (Signed)
[facility] [place] [facility]   PATIENT NAME: Trevor Schultz    MR#:  [id number]  DATE OF BIRTH:  [birth year]  DATE OF ADMISSION:  07/31/2016  PRIMARY CARE PHYSICIAN: No PCP Per Patient   REQUESTING/REFERRING PHYSICIAN:   CHIEF COMPLAINT:   Chief Complaint  Patient presents with  . Chest Pain    left side  . Loss of Consciousness    from the cp  . Shortness of Breath    hurts to breathe    HISTORY OF PRESENT ILLNESS: Trevor Schultz  is a 40 y.o. male withNo significant past medical history presented to the emergency room with chest pain in the left side and also episodes of passing out for the last 2 weeks. The chest pain is located in the left side of the chest and sharp in nature 6 out of 10 on a scale of 1-10. Pain is non radiating. Patient also complaints that he passed out 3 times in the last 2 weeks in the last one was yesterday. Whenever he passes out he has convulsions and then he becomes unresponsive and unable to speak for an hour. This has been going on for the last 3 episodes. He was never worked up in the past. After he presented to the emergency room first set of troponin was negative and CT head did not reveal any acute intracranial abnormality. Telemetry neurology consultation was done who recommended MRI brain and EEG to assess for any seizures. No weakness in any part of the body. No tingling or numbness sensation. No history of any slurred speech. Hospitalist service was consulted for further care of the patient. History and physical were done patient was awake completely oriented to time place and person verbally responsive. CT chest showed no evidence of aortic dissection, cardiomegaly noted and coarse calcification the lower left ventricular myocardium noted.   PAST MEDICAL HISTORY:   Past Medical History:  Diagnosis Date  . GERD (gastroesophageal reflux disease)     PAST SURGICAL HISTORY: Past Surgical History:   Procedure Laterality Date  . none      SOCIAL HISTORY:  Social History  Substance Use Topics  . Smoking status: Smoker, Current Status Unknown  . Smokeless tobacco: Never Used  . Alcohol use No    FAMILY HISTORY: No family history on file.  DRUG ALLERGIES: No Known Allergies  REVIEW OF SYSTEMS:   CONSTITUTIONAL: No fever, fatigue or weakness.  EYES: No blurred or double vision.  EARS, NOSE, AND THROAT: No tinnitus or ear pain.  RESPIRATORY: No cough, shortness of breath, wheezing or hemoptysis.  CARDIOVASCULAR: Has chest pain, No orthopnea, edema.  GASTROINTESTINAL: No nausea, vomiting, diarrhea or abdominal pain.  GENITOURINARY: No dysuria, hematuria.  ENDOCRINE: No polyuria, nocturia,  HEMATOLOGY: No anemia, easy bruising or bleeding SKIN: No rash or lesion. MUSCULOSKELETAL: No joint pain or arthritis.   NEUROLOGIC: No tingling, numbness, weakness.  PSYCHIATRY: No anxiety or depression.   MEDICATIONS AT HOME:  Prior to Admission medications   Medication Sig Start Date End Date Taking? Authorizing Provider  pantoprazole (PROTONIX) 40 MG tablet Take 1 tablet (40 mg total) by mouth daily. 07/07/16 07/07/17 Yes Darci Currentandolph N Brown, MD      PHYSICAL EXAMINATION:   VITAL SIGNS: Blood pressure (!) 114/92, pulse 65, temperature 97.8 F (36.6 C), temperature source Oral, resp. rate 16, height 5\' 5"  (1.651 m), weight 88.9 kg (196 lb), SpO2 96 %.  GENERAL:  40 y.o.-year-old patient lying in the  bed with no acute distress.  EYES: Pupils equal, round, reactive to light and accommodation. No scleral icterus. Extraocular muscles intact.  HEENT: Head atraumatic, normocephalic. Oropharynx and nasopharynx clear.  NECK:  Supple, no jugular venous distention. No thyroid enlargement, no tenderness.  LUNGS: Normal breath sounds bilaterally, no wheezing, rales,rhonchi or crepitation. No use of accessory muscles of respiration.  CARDIOVASCULAR: S1, S2 normal. No murmurs, rubs, or gallops.   ABDOMEN: Soft, nontender, nondistended. Bowel sounds present. No organomegaly or mass.  EXTREMITIES: No pedal edema, cyanosis, or clubbing.  NEUROLOGIC: Cranial nerves II through XII are intact. Muscle strength 5/5 in all extremities. Sensation intact. Gait not checked.  PSYCHIATRIC: The patient is alert and oriented x 3.  SKIN: No obvious rash, lesion, or ulcer.   LABORATORY PANEL:   CBC  Recent Labs Lab 08/01/16 0014  WBC 10.3  HGB 15.6  HCT 44.9  PLT 261  MCV 84.0  MCH 29.2  MCHC 34.8  RDW 14.2   ------------------------------------------------------------------------------------------------------------------  Chemistries   Recent Labs Lab 08/01/16 0014  NA 138  K 3.9  CL 106  CO2 23  GLUCOSE 106*  BUN 11  CREATININE 0.61  CALCIUM 9.4  AST 30  ALT 35  ALKPHOS 75  BILITOT <0.1*   ------------------------------------------------------------------------------------------------------------------ estimated creatinine clearance is 128.4 mL/min (by C-G formula based on SCr of 0.61 mg/dL). ------------------------------------------------------------------------------------------------------------------ No results for input(s): TSH, T4TOTAL, T3FREE, THYROIDAB in the last 72 hours.  Invalid input(s): FREET3   Coagulation profile No results for input(s): INR, PROTIME in the last 168 hours. ------------------------------------------------------------------------------------------------------------------- No results for input(s): DDIMER in the last 72 hours. -------------------------------------------------------------------------------------------------------------------  Cardiac Enzymes  Recent Labs Lab 08/01/16 0014  TROPONINI <0.03   ------------------------------------------------------------------------------------------------------------------ Invalid input(s):  POCBNP  ---------------------------------------------------------------------------------------------------------------  Urinalysis    Component Value Date/Time   COLORURINE YELLOW (A) 08/01/2016 0259   APPEARANCEUR CLEAR (A) 08/01/2016 0259   LABSPEC 1.009 08/01/2016 0259   PHURINE 6.0 08/01/2016 0259   GLUCOSEU NEGATIVE 08/01/2016 0259   HGBUR 1+ (A) 08/01/2016 0259   BILIRUBINUR NEGATIVE 08/01/2016 0259   KETONESUR NEGATIVE 08/01/2016 0259   PROTEINUR NEGATIVE 08/01/2016 0259   NITRITE NEGATIVE 08/01/2016 0259   LEUKOCYTESUR NEGATIVE 08/01/2016 0259     RADIOLOGY: Ct Head Wo Contrast  Result Date: 08/01/2016 CLINICAL DATA:  Chest pain and reported seizure. EXAM: CT HEAD WITHOUT CONTRAST TECHNIQUE: Contiguous axial images were obtained from the base of the skull through the vertex without intravenous contrast. COMPARISON:  None. FINDINGS: Brain: No mass lesion, intraparenchymal hemorrhage or extra-axial collection. No evidence of acute cortical infarct. Brain parenchyma and CSF-containing spaces are normal for age. Vascular: No hyperdense vessel or unexpected calcification. Skull: Normal visualized skull base, calvarium and extracranial soft tissues. Sinuses/Orbits: No sinus fluid levels or advanced mucosal thickening. No mastoid effusion. Normal orbits. IMPRESSION: Normal head CT. Electronically Signed   By: Deatra RobinsonKevin  Herman M.D.   On: 08/01/2016 03:54   Dg Chest Portable 1 View  Result Date: 08/01/2016 CLINICAL DATA:  Left upper chest pain EXAM: PORTABLE CHEST 1 VIEW COMPARISON:  07/07/2016 FINDINGS: There are low lung volumes. No acute consolidation or effusion. Mild cardiomegaly without overt failure. Oval calcification at the left lung base is unchanged. No pneumothorax IMPRESSION: 1. Low lung volumes without acute infiltrate 2. Mild cardiomegaly without overt failure Electronically Signed   By: Jasmine PangKim  Fujinaga M.D.   On: 08/01/2016 01:31   Ct Angio Chest Aorta W And/or Wo  Contrast  Result Date: 08/01/2016 CLINICAL DATA:  Chest pain pain with  breathing EXAM: CT ANGIOGRAPHY CHEST WITH CONTRAST TECHNIQUE: Multidetector CT imaging of the chest was performed using the standard protocol during bolus administration of intravenous contrast. Multiplanar CT image reconstructions and MIPs were obtained to evaluate the vascular anatomy. CONTRAST:  75 mL Isovue 370 intravenous COMPARISON:  Chest x-ray 08/01/2016 FINDINGS: Cardiovascular: There is no evidence for aortic dissection. Limited evaluation of the pulmonary artery's due to preferential opacification of the aorta. The heart appears slightly enlarged. There is a coarse calcification which appears to localize to the wall of the left ventricle. No pericardial effusion. 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 Trachea and mainstem bronchi are within normal limits. Esophagus is unremarkable. No mediastinal or hilar adenopathy. Lungs/Pleura: Lungs are clear. No pleural effusion or pneumothorax. Upper Abdomen: No acute abnormality. Musculoskeletal: No chest wall abnormality. No acute or significant osseous findings. Review of the MIP images confirms the above findings. IMPRESSION: 1. No CT evidence for acute aortic dissection. 2. Mild cardiomegaly. Coarse calcification appears to localize to the left ventricular myocardium, this could be related to previous myocardial infarct or myocarditis. There does not appear to be aneurysmal dilatation of the left ventricle. Electronically Signed   By: Jasmine Pang M.D.   On: 08/01/2016 03:57    EKG: Orders placed or performed during the hospital encounter of 07/31/16  . EKG 12-Lead  . EKG 12-Lead    IMPRESSION AND PLAN: 40 year old male patient with no significant past medical history presented to the emergency room with chest pain and episodes of passing out with generalized shaking. Admitting diagnosis : 1. Chest pain rule out acute cord syndrome 2. 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 Tobacco abuse Treatment plan : Admit patient to telemetry observation bed Check MRI of brain to rule out intracranial abnormality Check EEG to assess for any seizure activity Neurology consultation Start patient on aspirin Cycle troponin to rule out ischemia Check echocardiogram   All the records are reviewed and case discussed with ED provider. Management plans discussed with the patient, family and they are in agreement.  CODE STATUS:FULL CODE Code Status History    This patient does not have a recorded code status. Please follow your organizational policy for patients in this situation.       TOTAL TIME TAKING CARE OF THIS PATIENT: 52 minutes.    Ihor Austin M.D on 08/01/2016 at 4:52 AM  Between 7am to 6pm - Pager - [phone]  After 6pm go to [email or web address] - password [email or web address] [facility]  [facility]  Office  [phone]  CC: Primary care physician; No PCP Per Patient

## 2016-08-01 NOTE — Progress Notes (Signed)
Received pt from ER, pt is alert and oriented, denies any pain at this time. Pt is spanish speaking only. Pt is place tele monitor #[id number] and NSR read back. VSS. Pt oriented to room and call light. Pt wife at bedside. Denies any need at this time. Bed to lowest position and call light at reach. Will report off to day shift RN to make Spanish translator available  for patient.

## 2016-08-01 NOTE — ED Notes (Signed)
[facility]  [facility] bedside. CP gone now and back to normal. Passes out many times. Pt's brother reports out for 1.5hr. Happens every day passing out for the last week.

## 2016-08-01 NOTE — Consult Note (Signed)
Reason for Consult:Syncope Referring Physician: Mody  CC: Syncope  HPI: Trevor Schultz is an 40 y.o. male with no significant past medical history who speaks only [place].  History obtained through using an interpreter.  Patient reports that he has had syncopal spells for the past 2 years or so.  Initially they were not as frequent and occurred once every 1-2 months.  He has now passed out three times in the past 2 weeks.  Episodes seem to be fairly stereotypical.  They start with left sided chest pain.  The patient then passes out and clenches up all over.  This lasts for about 30 minutes.  Patient is then unable to normally respond for about 2 hours.  Patient reports that he is amnestic of the event after the chest pain.  He has no bowel or bladder incontinence.  He has no tongue biting.  Usually he knows they are coming after he has the chest pain and sits down.  He has had episodes when he is unable to get down as with the episode that led to this admission but has not had any injury or bruising.    Past Medical History:  Diagnosis Date  . Anginal pain (HCC)   . Dyspnea   . GERD (gastroesophageal reflux disease)   . Seizures (HCC)    possible,  may be the reason for admission    Past Surgical History:  Procedure Laterality Date  . none      History reviewed. No pertinent family history.  Social History:  reports that he has been smoking.  He has never used smokeless tobacco. He reports that he does not drink alcohol or use drugs.  No Known Allergies  Medications:  I have reviewed the patient's current medications. Prior to Admission:  Prescriptions Prior to Admission  Medication Sig Dispense Refill Last Dose  . pantoprazole (PROTONIX) 40 MG tablet Take 1 tablet (40 mg total) by mouth daily. 30 tablet 0 08/01/2016 at Unknown time   Scheduled: . [START ON 08/02/2016] aspirin EC  81 mg Oral Daily  . enoxaparin (LOVENOX) injection  40 mg Subcutaneous Q24H  . fenofibrate  160  mg Oral Daily  . [START ON 08/02/2016] pantoprazole  40 mg Oral Daily    ROS: History obtained from the patient  General ROS: negative for - chills, fatigue, fever, night sweats, weight gain or weight loss Psychological ROS: negative for - behavioral disorder, hallucinations, memory difficulties, mood swings or suicidal ideation Ophthalmic ROS: negative for - blurry vision, double vision, eye pain or loss of vision ENT ROS: negative for - epistaxis, nasal discharge, oral lesions, sore throat, tinnitus or vertigo Allergy and Immunology ROS: negative for - hives or itchy/watery eyes Hematological and Lymphatic ROS: negative for - bleeding problems, bruising or swollen lymph nodes Endocrine ROS: negative for - galactorrhea, hair pattern changes, polydipsia/polyuria or temperature intolerance Respiratory ROS: negative for - cough, hemoptysis, shortness of breath or wheezing Cardiovascular ROS: chest pain Gastrointestinal ROS: negative for - abdominal pain, diarrhea, hematemesis, nausea/vomiting or stool incontinence Genito-Urinary ROS: negative for - dysuria, hematuria, incontinence or urinary frequency/urgency Musculoskeletal ROS: negative for - joint swelling or muscular weakness Neurological ROS: as noted in HPI Dermatological ROS: negative for rash and skin lesion changes  Physical Examination: Blood pressure 112/73, pulse 66, temperature 97.8 F (36.6 C), temperature source Oral, resp. rate 18, height 5\' 5"  (1.651 m), weight 88.9 kg (196 lb), SpO2 95 %.  HEENT-  Normocephalic, no lesions, without obvious abnormality.  Normal external eye and conjunctiva.  Normal TM's bilaterally.  Normal auditory canals and external ears. Normal external nose, mucus membranes and septum.  Normal pharynx. Cardiovascular- S1, S2 normal, pulses palpable throughout   Lungs- chest clear, no wheezing, rales, normal symmetric air entry Abdomen- soft, non-tender; bowel sounds normal; no masses,  no  organomegaly Extremities- no edema Lymph-no adenopathy palpable Musculoskeletal-no joint tenderness, deformity or swelling Skin-warm and dry, no hyperpigmentation, vitiligo, or suspicious lesions  Neurological Examination Mental Status: Alert, oriented, thought content appropriate.  Speech fluent without evidence of aphasia.  Able to follow 3 step commands without difficulty. Cranial Nerves: II: Discs flat bilaterally; Visual fields grossly normal, pupils equal, round, reactive to light and accommodation III,IV, VI: ptosis not present, extra-ocular motions intact bilaterally V,VII: smile symmetric, facial light touch sensation normal bilaterally VIII: hearing normal bilaterally IX,X: gag reflex present XI: bilateral shoulder shrug XII: midline tongue extension Motor: Right : Upper extremity   5/5    Left:     Upper extremity   5/5  Lower extremity   5/5     Lower extremity   5/5 Tone and bulk:normal tone throughout; no atrophy noted Sensory: Pinprick and light touch intact throughout, bilaterally Deep Tendon Reflexes: 2+ and symmetric throughout Plantars: Right: downgoing   Left: downgoing Cerebellar: normal finger-to-nose, normal rapid alternating movements and normal heel-to-shin test Gait: normal gait and station    Laboratory Studies:   Basic Metabolic Panel:  Recent Labs Lab 08/01/16 0014 08/01/16 0825  NA 138 138  K 3.9 3.8  CL 106 105  CO2 23 25  GLUCOSE 106* 95  BUN 11 10  CREATININE 0.61 0.55*  CALCIUM 9.4 9.2    Liver Function Tests:  Recent Labs Lab 08/01/16 0014  AST 30  ALT 35  ALKPHOS 75  BILITOT <0.1*  PROT 7.6  ALBUMIN 4.3    Recent Labs Lab 08/01/16 0014  LIPASE 26   No results for input(s): AMMONIA in the last 168 hours.  CBC:  Recent Labs Lab 08/01/16 0014 08/01/16 0825  WBC 10.3 8.3  HGB 15.6 15.4  HCT 44.9 44.9  MCV 84.0 83.3  PLT 261 250    Cardiac Enzymes:  Recent Labs Lab 08/01/16 0014  TROPONINI <0.03     BNP: Invalid input(s): POCBNP  CBG: No results for input(s): GLUCAP in the last 168 hours.  Microbiology: No results found for this or any previous visit.  Coagulation Studies: No results for input(s): LABPROT, INR in the last 72 hours.  Urinalysis:  Recent Labs Lab 08/01/16 0259  COLORURINE YELLOW*  LABSPEC 1.009  PHURINE 6.0  GLUCOSEU NEGATIVE  HGBUR 1+*  BILIRUBINUR NEGATIVE  KETONESUR NEGATIVE  PROTEINUR NEGATIVE  NITRITE NEGATIVE  LEUKOCYTESUR NEGATIVE    Lipid Panel:     Component Value Date/Time   CHOL 229 (H) 08/01/2016 0825   TRIG 459 (H) 08/01/2016 0825   HDL 27 (L) 08/01/2016 0825   CHOLHDL 8.5 08/01/2016 0825   VLDL UNABLE TO CALCULATE IF TRIGLYCERIDE OVER 400 mg/dL 16/10/960411/28/2017 54090825   LDLCALC UNABLE TO CALCULATE IF TRIGLYCERIDE OVER 400 mg/dL 81/19/147811/28/2017 29560825    OZHY8MHgbA1C: No results found for: HGBA1C  Urine Drug Screen:     Component Value Date/Time   LABOPIA NONE DETECTED 08/01/2016 0259   COCAINSCRNUR NONE DETECTED 08/01/2016 0259   LABBENZ NONE DETECTED 08/01/2016 0259   AMPHETMU NONE DETECTED 08/01/2016 0259   THCU NONE DETECTED 08/01/2016 0259   LABBARB NONE DETECTED 08/01/2016 0259    Alcohol Level:  Recent Labs Lab 08/01/16 0014  ETH <5    Other results: EKG: sinus rhythm at 84 bpm.  Imaging: Ct Head Wo Contrast  Result Date: 08/01/2016 CLINICAL DATA:  Chest pain and reported seizure. EXAM: CT HEAD WITHOUT CONTRAST TECHNIQUE: Contiguous axial images were obtained from the base of the skull through the vertex without intravenous contrast. COMPARISON:  None. FINDINGS: Brain: No mass lesion, intraparenchymal hemorrhage or extra-axial collection. No evidence of acute cortical infarct. Brain parenchyma and CSF-containing spaces are normal for age. Vascular: No hyperdense vessel or unexpected calcification. Skull: Normal visualized skull base, calvarium and extracranial soft tissues. Sinuses/Orbits: No sinus fluid levels or advanced  mucosal thickening. No mastoid effusion. Normal orbits. IMPRESSION: Normal head CT. Electronically Signed   By: Deatra Robinson M.D.   On: 08/01/2016 03:54   Mr Laqueta Jean ZO Contrast  Result Date: 08/01/2016 CLINICAL DATA:  Syncopal episodes.  Possible seizure. EXAM: MRI HEAD WITHOUT AND WITH CONTRAST TECHNIQUE: Multiplanar, multiecho pulse sequences of the brain and surrounding structures were obtained without and with intravenous contrast. CONTRAST:  18mL MULTIHANCE GADOBENATE DIMEGLUMINE 529 MG/ML IV SOLN COMPARISON:  Head CT same day FINDINGS: Brain: The brain has normal appearance without evidence of malformation, atrophy, old or acute small or large vessel infarction, hemorrhage, hydrocephalus or extra-axial collection. No pituitary abnormality. After contrast administration, no abnormal enhancement occurs. Mesial temporal lobes are within normal limits. Vascular: Major vessels at the base of the brain show flow. Skull and upper cervical spine: Normal Sinuses/Orbits: Mild mucosal thickening of the maxillary sinuses. Orbits negative. Other: None significant. IMPRESSION: Normal examination. No abnormality seen to explain the clinical presentation. Electronically Signed   By: Paulina Fusi M.D.   On: 08/01/2016 12:25   Dg Chest Portable 1 View  Result Date: 08/01/2016 CLINICAL DATA:  Left upper chest pain EXAM: PORTABLE CHEST 1 VIEW COMPARISON:  07/07/2016 FINDINGS: There are low lung volumes. No acute consolidation or effusion. Mild cardiomegaly without overt failure. Oval calcification at the left lung base is unchanged. No pneumothorax IMPRESSION: 1. Low lung volumes without acute infiltrate 2. Mild cardiomegaly without overt failure Electronically Signed   By: Jasmine Pang M.D.   On: 08/01/2016 01:31   Ct Angio Chest Aorta W And/or Wo Contrast  Result Date: 08/01/2016 CLINICAL DATA:  Chest pain pain with breathing EXAM: CT ANGIOGRAPHY CHEST WITH CONTRAST TECHNIQUE: Multidetector CT imaging of the  chest was performed using the standard protocol during bolus administration of intravenous contrast. Multiplanar CT image reconstructions and MIPs were obtained to evaluate the vascular anatomy. CONTRAST:  75 mL Isovue 370 intravenous COMPARISON:  Chest x-ray 08/01/2016 FINDINGS: Cardiovascular: There is no evidence for aortic dissection. Limited evaluation of the pulmonary artery's due to preferential opacification of the aorta. The heart appears slightly enlarged. There is a coarse calcification which appears to localize to the wall of the left ventricle. No pericardial effusion. 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 Trachea and mainstem bronchi are within normal limits. Esophagus is unremarkable. No mediastinal or hilar adenopathy. Lungs/Pleura: Lungs are clear. No pleural effusion or pneumothorax. Upper Abdomen: No acute abnormality. Musculoskeletal: No chest wall abnormality. No acute or significant osseous findings. Review of the MIP images confirms the above findings. IMPRESSION: 1. No CT evidence for acute aortic dissection. 2. Mild cardiomegaly. Coarse calcification appears to localize to the left ventricular myocardium, this could be related to previous myocardial infarct or myocarditis. There does not appear to be aneurysmal dilatation of the left ventricle. Electronically Signed  By: Jasmine Pang M.D.   On: 08/01/2016 03:57     Assessment/Plan: 40 year old male presenting with syncopal episodes that are preceded by chest pain.  Although it appears that the episodes are fairly stereotypical they are not necessarily consistent with seizure activity, particularly in the setting of a normal work up.  Episode and post-episode symptoms are quite prolonged.  MRI of the brain personally reviewed and is normal.  EEG reviewed and is normal a well.    Recommendations: 1.  Anticonvulsant therapy not indicated at this time.  Patient may benefit from ambulatory monitoring as an  outpatient since episodes are frequent enough.  Patient to follow up with neurology as an outpatient. 2.  Agree with cardiology evaluation 3.  Patient unable to drive, operate heavy machinery, perform activities at heights and participate in water activities until release by outpatient physician.   Thana Farr, MD Neurology [phone] 08/01/2016, 3:29 PM

## 2016-08-01 NOTE — Consult Note (Signed)
[facility]  Patient ID: Trevor Schultz, MRN: [id number], DOB/AGE: [birth year] 40 y.o. Admit date: 07/31/2016   Date of Consult: 08/01/2016 Primary Physician: No PCP Per Patient Primary Cardiologist: None  Chief Complaint:  Chief Complaint  Patient presents with  . Chest Pain    left side  . Loss of Consciousness    from the cp  . Shortness of Breath    hurts to breathe   Reason for Consult: HPI    Chest Pain   Additional comments: left side        Loss of Consciousness   Additional comments: from the cp        Shortness of Breath   Additional comments: hurts to breathe     Last edited by Dagoberto Ligasina M Kimrey Brown, RN on 08/01/2016 12:20 AM. (History)       HPI: 40 y.o. male with no significant cardiovascular history in the past having episodes of syncope. The patient has had 2 episodes recently for which he has sudden substernal chest discomfort and pressure with some mild shortness of breath at rest and then is unable to stay awake and passes out without hurting himself and then has issues with rolling back of his eyes and some straightening of his extremities and following of the mouth. This lasts for several minutes and then the patient is drowsy thereafter. There has been no evidence of jerky type motions witnessed. The patient has been physically active recently with no current evidence of chest pain or shortness of breath with activities or walking up stairs. He has not had any dizzy spells as well. The patient has had no evidence of swelling in the legs or congestive heart failure type symptoms. Patient does have apparently cardiomegaly by chest x-ray and CT for which we will further investigate  Past Medical History:  Diagnosis Date  . Anginal pain (HCC)   . Dyspnea   . GERD (gastroesophageal reflux disease)   . Seizures (HCC)    possible,  may be the reason for admission      Surgical History:  Past Surgical History:  Procedure  Laterality Date  . none       Home Meds: Prior to Admission medications   Medication Sig Start Date End Date Taking? Authorizing Provider  pantoprazole (PROTONIX) 40 MG tablet Take 1 tablet (40 mg total) by mouth daily. 07/07/16 07/07/17 Yes Darci Currentandolph N Brown, MD    Inpatient Medications:  . aspirin  324 mg Oral NOW   Or  . aspirin  300 mg Rectal NOW  . [START ON 08/02/2016] aspirin EC  81 mg Oral Daily  . enoxaparin (LOVENOX) injection  40 mg Subcutaneous Q24H  . [START ON 08/02/2016] pantoprazole  40 mg Oral Daily     Allergies: No Known Allergies  Social History   Social History  . Marital status: Married    Spouse name: N/A  . Number of children: N/A  . Years of education: N/A   Occupational History  . Corporate investment bankerconstruction worker    Social History Main Topics  . Smoking status: Smoker, Current Status Unknown  . Smokeless tobacco: Never Used  . Alcohol use No  . Drug use: No  . Sexual activity: Yes   Other Topics Concern  . Not on file   Social History Narrative  . No narrative on file     No family history on file.   Review of Systems Positive for syncope chest pain shortness of breath Negative  for: General:  chills, fever, night sweats or weight changes.  Cardiovascular: PND orthopnea positive for syncope negative for dizziness  Dermatological skin lesions rashes Respiratory: Cough congestion Urologic: Frequent urination urination at night and hematuria Abdominal: negative for nausea, vomiting, diarrhea, bright red blood per rectum, melena, or hematemesis Neurologic: negative for visual changes, and/or hearing changes  All other systems reviewed and are otherwise negative except as noted above.  Labs:  Recent Labs  08/01/16 0014  TROPONINI <0.03   Lab Results  Component Value Date   WBC 10.3 08/01/2016   HGB 15.6 08/01/2016   HCT 44.9 08/01/2016   MCV 84.0 08/01/2016   PLT 261 08/01/2016    Recent Labs Lab 08/01/16 0014  NA 138  K 3.9  CL 106   CO2 23  BUN 11  CREATININE 0.61  CALCIUM 9.4  PROT 7.6  BILITOT <0.1*  ALKPHOS 75  ALT 35  AST 30  GLUCOSE 106*   No results found for: CHOL, HDL, LDLCALC, TRIG No results found for: DDIMER  Radiology/Studies:  Dg Chest 2 View  Addendum Date: 07/07/2016   ADDENDUM REPORT: 07/07/2016 03:18 ADDENDUM: The prominent calcified density overlying the left lung base most likely reflects a large calcified granuloma. Alternatively, if it resides within the left ventricle, it could reflect calcification near the mitral annulus. Regardless, it has a benign appearance. Electronically Signed   By: Roanna Raider M.D.   On: 07/07/2016 03:18   Result Date: 07/07/2016 CLINICAL DATA:  Acute onset of generalized chest pain. Initial encounter. EXAM: CHEST  2 VIEW COMPARISON:  None. FINDINGS: The lungs are well-aerated and clear. There is no evidence of focal opacification, pleural effusion or pneumothorax. The heart is borderline enlarged. No acute osseous abnormalities are seen. IMPRESSION: Borderline cardiomegaly.  Lungs remain grossly clear. Electronically Signed: By: Roanna Raider M.D. On: 07/07/2016 02:49   Ct Head Wo Contrast  Result Date: 08/01/2016 CLINICAL DATA:  Chest pain and reported seizure. EXAM: CT HEAD WITHOUT CONTRAST TECHNIQUE: Contiguous axial images were obtained from the base of the skull through the vertex without intravenous contrast. COMPARISON:  None. FINDINGS: Brain: No mass lesion, intraparenchymal hemorrhage or extra-axial collection. No evidence of acute cortical infarct. Brain parenchyma and CSF-containing spaces are normal for age. Vascular: No hyperdense vessel or unexpected calcification. Skull: Normal visualized skull base, calvarium and extracranial soft tissues. Sinuses/Orbits: No sinus fluid levels or advanced mucosal thickening. No mastoid effusion. Normal orbits. IMPRESSION: Normal head CT. Electronically Signed   By: Deatra Robinson M.D.   On: 08/01/2016 03:54   Dg  Chest Portable 1 View  Result Date: 08/01/2016 CLINICAL DATA:  Left upper chest pain EXAM: PORTABLE CHEST 1 VIEW COMPARISON:  07/07/2016 FINDINGS: There are low lung volumes. No acute consolidation or effusion. Mild cardiomegaly without overt failure. Oval calcification at the left lung base is unchanged. No pneumothorax IMPRESSION: 1. Low lung volumes without acute infiltrate 2. Mild cardiomegaly without overt failure Electronically Signed   By: Jasmine Pang M.D.   On: 08/01/2016 01:31   Ct Angio Chest Aorta W And/or Wo Contrast  Result Date: 08/01/2016 CLINICAL DATA:  Chest pain pain with breathing EXAM: CT ANGIOGRAPHY CHEST WITH CONTRAST TECHNIQUE: Multidetector CT imaging of the chest was performed using the standard protocol during bolus administration of intravenous contrast. Multiplanar CT image reconstructions and MIPs were obtained to evaluate the vascular anatomy. CONTRAST:  75 mL Isovue 370 intravenous COMPARISON:  Chest x-ray 08/01/2016 FINDINGS: Cardiovascular: There is no evidence for aortic dissection.  Limited evaluation of the pulmonary artery's due to preferential opacification of the aorta. The heart appears slightly enlarged. There is a coarse calcification which appears to localize to the wall of the left ventricle. No pericardial effusion. 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 Trachea and mainstem bronchi are within normal limits. Esophagus is unremarkable. No mediastinal or hilar adenopathy. Lungs/Pleura: Lungs are clear. No pleural effusion or pneumothorax. Upper Abdomen: No acute abnormality. Musculoskeletal: No chest wall abnormality. No acute or significant osseous findings. Review of the MIP images confirms the above findings. IMPRESSION: 1. No CT evidence for acute aortic dissection. 2. Mild cardiomegaly. Coarse calcification appears to localize to the left ventricular myocardium, this could be related to previous myocardial infarct or myocarditis. There  does not appear to be aneurysmal dilatation of the left ventricle. Electronically Signed   By: Jasmine PangKim  Fujinaga M.D.   On: 08/01/2016 03:57   [place] Abdomen Limited Ruq  Result Date: 07/07/2016 CLINICAL DATA:  Right upper quadrant pain for several days. EXAM: US ABDOMEN LIMITED - RIGHT UPPER QUADRANT COMPARISON:  None. FINDINGS: Gallbladder: No gallstones or wall thickening visualized. No sonographic Murphy sign noted by sonographer. Common bile duct: Diameter: 4 mm Liver: No focal lesion identified. Within normal limits in parenchymal echogenicity. IMPRESSION: Normal liver, gallbladder and bile ducts Electronically Signed   By: Ellery Plunkaniel R Mitchell M.D.   On: 07/07/2016 03:19    EKG: Normal sinus rhythm  Weights: Filed Weights   08/01/16 0000  Weight: 88.9 kg (196 lb)     Physical Exam: Blood pressure 124/78, pulse 67, temperature 98.2 F (36.8 C), temperature source Oral, resp. rate 18, height 5\' 5"  (1.651 m), weight 88.9 kg (196 lb), SpO2 92 %. Body mass index is 32.62 kg/m. General: Well developed, well nourished, in no acute distress. Head eyes ears nose throat: Normocephalic, atraumatic, sclera non-icteric, no xanthomas, nares are without discharge. No apparent thyromegaly and/or mass  Lungs: Normal respiratory effort.  no wheezes, no rales, no rhonchi.  Heart: RRR with normal S1 S2. no murmur gallop, no rub, PMI is normal size and placement, carotid upstroke normal without bruit, jugular venous pressure is normal Abdomen: Soft, non-tender, non-distended with normoactive bowel sounds. No hepatomegaly. No rebound/guarding. No obvious abdominal masses. Abdominal aorta is normal size without bruit Extremities: No edema. no cyanosis, no clubbing, no ulcers  Peripheral : 2+ bilateral upper extremity pulses, 2+ bilateral femoral pulses, 2+ bilateral dorsal pedal pulse Neuro: Alert and oriented. No facial asymmetry. No focal deficit. Moves all extremities spontaneously. Musculoskeletal: Normal  muscle tone without kyphosis Psych:  Responds to questions appropriately with a normal affect.    Assessment: 40 year old male with no previous cardiovascular history having syncopal episodes of unknown etiology and intensive heart failure or myocardial infarction  Plan: 1. Continue telemetry and telemetry Unit nursing to assess for causes of syncope 2. Echocardiogram for cardiomegaly congestive heart failure or other reactive abnormalities causing above 3. Further consideration of treadmill EKG to assess for rhythm disturbances after above 4. Further evaluation of possible neurologic abnormality and/or seizures  Signed, Lamar BlinksBruce J Riyan Haile M.D. [facility] 08/01/2016, 8:08 AM

## 2016-08-02 ENCOUNTER — Observation Stay
Admit: 2016-08-02 | Discharge: 2016-08-02 | Disposition: A | Discharge status: Home / Self Care | Payer: Self-pay | Attending: Internal Medicine | Admitting: Internal Medicine

## 2016-08-02 LAB — ECHOCARDIOGRAM COMPLETE
Height: 65 in
WEIGHTICAEL: 3136 [oz_av]

## 2016-08-02 MED ORDER — ATORVASTATIN CALCIUM 40 MG PO TABS: 40.0000 mg | ORAL_TABLET | Freq: Every day | ORAL | 2 refills | Status: AC

## 2016-08-02 MED ORDER — ASPIRIN 81 MG PO TBEC: 81.0000 mg | DELAYED_RELEASE_TABLET | Freq: Every day | ORAL | 2 refills | Status: AC

## 2016-08-02 MED ORDER — NITROGLYCERIN 0.4 MG SL SUBL: 0.4000 mg | SUBLINGUAL_TABLET | SUBLINGUAL | 0 refills | Status: AC | PRN

## 2016-08-02 MED ORDER — FENOFIBRATE 160 MG PO TABS: 160.0000 mg | ORAL_TABLET | Freq: Every day | ORAL | 2 refills | Status: DC

## 2016-08-02 NOTE — Progress Notes (Signed)
  [facility] [place] [facility]        Trevor Schultz was admitted to the Hospital on 07/31/2016 and Discharged  08/02/2016 and should be excused from work/school   for 7 days starting 07/31/2016 , may return to work but is unable to drive, operate heavy machinery, perform activities at heights and participate in water activities until release by outpatient physician.  [name], [name] M.D on 08/02/2016,at 12:22 PM  [facility] [place] [facility]    Office  [phone]

## 2016-08-02 NOTE — Care Management (Signed)
Through interpretor, provided patient with applications for [facility].  Faxed hs discharge meds to the clinic and instructed on location for pick up

## 2016-08-02 NOTE — Discharge Summary (Signed)
[facility] [place] [facility]   PATIENT NAME: Trevor JumboMarvin Flores Schultz    MR#:  [id number]  DATE OF BIRTH:  04/04/1976  DATE OF ADMISSION:  07/31/2016   ADMITTING PHYSICIAN: Ihor AustinPavan Pyreddy, MD  DATE OF DISCHARGE: 08/02/2016 12:38 PM  PRIMARY CARE PHYSICIAN: No PCP Per Patient   ADMISSION DIAGNOSIS:  Seizure (HCC) [R56.9] Chest pain, unspecified type [R07.9] DISCHARGE DIAGNOSIS:  Principal Problem:   Chest pain Active Problems:   Seizure (HCC)  SECONDARY DIAGNOSIS:   Past Medical History:  Diagnosis Date  . Anginal pain (HCC)   . Dyspnea   . GERD (gastroesophageal reflux disease)   . Seizures (HCC)    possible,  may be the reason for admission   HOSPITAL COURSE:  40 year old male patient with no significant past medical history presented to the emergency room with chest pain and episodes of passing out with generalized shaking. Admitting diagnosis : 1. Chest pain, no ACS. Started aspirin and Lipitor. Continue as home medication. Echocardiograph showed normal ejection fraction 55-65%. Per Dr. Gwen PoundsKowalski, Possible need for either event monitor and/or longer monitor to assess for rhythm disturbances as outpatient.  * Hyperlipidemia. LDL and VLDL unablet to calculate. Started Lipitor and follow-up Dr. Gwen PoundsKowalski as outpatient.  2. Seizure disorder versus syncope versus conversion disorder. Normal EEG and brain MRI. Per Dr. Thad Rangereynolds, Anticonvulsant therapy not indicated at this time.  Patient may benefit from ambulatory monitoring as an outpatient since episodes are frequent enough.  Patient to follow up with neurology as an outpatient.  Patient unable to drive, operate heavy machinery, perform activities at heights and participate in water activities until release by outpatient physician.  3. Tobacco abuse. Smoking cessation was counseled for 3 minutes.  All the interview and physical examinations are via Spanish interpreter. I discussed with Dr.  Thad Rangereynolds. DISCHARGE CONDITIONS:  Stable, discharged to home today. CONSULTS OBTAINED:  Treatment Team:  Lamar BlinksBruce J Kowalski, MD Thana FarrLeslie Reynolds, MD DRUG ALLERGIES:  No Known Allergies DISCHARGE MEDICATIONS:     Medication List    TAKE these medications   aspirin 81 MG EC tablet Take 1 tablet (81 mg total) by mouth daily. Start taking on:  08/03/2016   atorvastatin 40 MG tablet Commonly known as:  LIPITOR Take 1 tablet (40 mg total) by mouth daily.   nitroGLYCERIN 0.4 MG SL tablet Commonly known as:  NITROSTAT Place 1 tablet (0.4 mg total) under the tongue every 5 (five) minutes x 3 doses as needed for chest pain.   pantoprazole 40 MG tablet Commonly known as:  PROTONIX Take 1 tablet (40 mg total) by mouth daily.        DISCHARGE INSTRUCTIONS:  See AVS.  If you experience worsening of your admission symptoms, develop shortness of breath, life threatening emergency, suicidal or homicidal thoughts you must seek medical attention immediately by calling 911 or calling your MD immediately  if symptoms less severe.  You Must read complete instructions/literature along with all the possible adverse reactions/side effects for all the Medicines you take and that have been prescribed to you. Take any new Medicines after you have completely understood and accpet all the possible adverse reactions/side effects.   Please note  You were cared for by a hospitalist during your hospital stay. If you have any questions about your discharge medications or the care you received while you were in the hospital after you are discharged, you can call the unit and asked to speak with the hospitalist on call if the  hospitalist that took care of you is not available. Once you are discharged, your primary care physician will handle any further medical issues. Please note that NO REFILLS for any discharge medications will be authorized once you are discharged, as it is imperative that you return to your  primary care physician (or establish a relationship with a primary care physician if you do not have one) for your aftercare needs so that they can reassess your need for medications and monitor your lab values.    On the day of Discharge:  VITAL SIGNS:  Blood pressure 124/70, pulse 65, temperature 98 F (36.7 C), temperature source Oral, resp. rate 18, height 5\' 5"  (1.651 m), weight 196 lb (88.9 kg), SpO2 93 %. PHYSICAL EXAMINATION:  GENERAL:  40 y.o.-year-old patient lying in the bed with no acute distress.  EYES: Pupils equal, round, reactive to light and accommodation. No scleral icterus. Extraocular muscles intact.  HEENT: Head atraumatic, normocephalic. Oropharynx and nasopharynx clear.  NECK:  Supple, no jugular venous distention. No thyroid enlargement, no tenderness.  LUNGS: Normal breath sounds bilaterally, no wheezing, rales,rhonchi or crepitation. No use of accessory muscles of respiration.  CARDIOVASCULAR: S1, S2 normal. No murmurs, rubs, or gallops.  ABDOMEN: Soft, non-tender, non-distended. Bowel sounds present. No organomegaly or mass.  EXTREMITIES: No pedal edema, cyanosis, or clubbing.  NEUROLOGIC: Cranial nerves II through XII are intact. Muscle strength 5/5 in all extremities. Sensation intact. Gait not checked.  PSYCHIATRIC: The patient is alert and oriented x 3.  SKIN: No obvious rash, lesion, or ulcer.  DATA REVIEW:   CBC  Recent Labs Lab 08/01/16 0825  WBC 8.3  HGB 15.4  HCT 44.9  PLT 250    Chemistries   Recent Labs Lab 08/01/16 0014 08/01/16 0825  NA 138 138  K 3.9 3.8  CL 106 105  CO2 23 25  GLUCOSE 106* 95  BUN 11 10  CREATININE 0.61 0.55*  CALCIUM 9.4 9.2  AST 30  --   ALT 35  --   ALKPHOS 75  --   BILITOT <0.1*  --      Microbiology Results  No results found for this or any previous visit.  RADIOLOGY:  No results found.   Management plans discussed with the patient, his wife and they are in agreement.  CODE STATUS:  Code  Status History    Date Active Date Inactive Code Status Order ID Comments User Context   08/01/2016  7:53 AM 08/02/2016  3:38 PM Full Code [id number]  Ihor AustinPavan Pyreddy, MD Inpatient      TOTAL TIME TAKING CARE OF THIS PATIENT: 38 minutes.    [name], Lei Dower M.D on 08/02/2016 at 6:08 PM  Between 7am to 6pm - Pager - [phone]  After 6pm go to [email or web address] - Scientist, research (life sciences)password [email or web address]  [facility] [place] [facility]  Office  [phone]  CC: Primary care physician; No PCP Per Patient   Note: This dictation was prepared with Dragon dictation along with smaller phrase technology. Any transcriptional errors that result from this process are unintentional.

## 2016-08-02 NOTE — Discharge Instructions (Signed)
Heart healthy diet. Activity as tolerated. Exercise and diet control. Smoking cessation. Patient unable to drive, operate heavy machinery, perform activities at heights and participate in water activities until release by outpatient physician.

## 2016-08-02 NOTE — Progress Notes (Signed)
*  PRELIMINARY RESULTS* Echocardiogram 2D Echocardiogram has been performed.  Cristela BlueHege, Reena Borromeo 08/02/2016, 7:52 AM

## 2016-08-02 NOTE — Progress Notes (Signed)
Patient given discharge teaching and paperwork regarding medications, diet, follow-up appointments and activity. Patient understanding verbalized. No complaints at this time. IV and telemetry discontinued prior to leaving. Skin assessment as previously charted and vitals are stable; on room air. Patient being discharged to home. Caregiver/family present during discharge teaching. No further needs by Care Management. Prescriptions handed to patient. - all of the above reviewed with interpreter at bedside.

## 2016-08-02 NOTE — Progress Notes (Signed)
[facility] Encounter Note  Patient: Trevor Schultz / Admit Date: 07/31/2016 / Date of Encounter: 08/02/2016, 8:15 AM   Subjective: No specific Episodes of syncope or seizure like activity overnight. Telemetry shows normal sinus rhythm without evidence of rhythm disturbances and/or heart block. Neurology suspects of other etiology and currently does not suggest seizure activity  Review of Systems: Positive for: None Negative for: Vision change, hearing change, syncope, dizziness, nausea, vomiting,diarrhea, bloody stool, stomach pain, cough, congestion, diaphoresis, urinary frequency, urinary pain,skin lesions, skin rashes Others previously listed  Objective: Telemetry: Normal sinus rhythm Physical Exam: Blood pressure 115/69, pulse 71, temperature 98 F (36.7 C), temperature source Oral, resp. rate 18, height 5\' 5"  (1.651 m), weight 88.9 kg (196 lb), SpO2 93 %. Body mass index is 32.62 kg/m. General: Well developed, well nourished, in no acute distress. Head: Normocephalic, atraumatic, sclera non-icteric, no xanthomas, nares are without discharge. Neck: No apparent masses Lungs: Normal respirations with no wheezes, no rhonchi, no rales , no crackles   Heart: Regular rate and rhythm, normal S1 S2, no murmur, no rub, no gallop, PMI is normal size and placement, carotid upstroke normal without bruit, jugular venous pressure normal Abdomen: Soft, non-tender, non-distended with normoactive bowel sounds. No hepatosplenomegaly. Abdominal aorta is normal size without bruit Extremities: No edema, no clubbing, no cyanosis, no ulcers,  Peripheral: 2+ radial, 2+ femoral, 2+ dorsal pedal pulses Neuro: Alert and oriented. Moves all extremities spontaneously. Psych:  Responds to questions appropriately with a normal affect.   Intake/Output Summary (Last 24 hours) at 08/02/16 0815 Last data filed at 08/02/16 0400  Gross per 24 hour  Intake              240 ml  Output                 0 ml  Net              240 ml    Inpatient Medications:  . aspirin EC  81 mg Oral Daily  . enoxaparin (LOVENOX) injection  40 mg Subcutaneous Q24H  . fenofibrate  160 mg Oral Daily  . pantoprazole  40 mg Oral Daily   Infusions:   Labs:  Recent Labs  08/01/16 0014 08/01/16 0825  NA 138 138  K 3.9 3.8  CL 106 105  CO2 23 25  GLUCOSE 106* 95  BUN 11 10  CREATININE 0.61 0.55*  CALCIUM 9.4 9.2    Recent Labs  08/01/16 0014  AST 30  ALT 35  ALKPHOS 75  BILITOT <0.1*  PROT 7.6  ALBUMIN 4.3    Recent Labs  08/01/16 0014 08/01/16 0825  WBC 10.3 8.3  HGB 15.6 15.4  HCT 44.9 44.9  MCV 84.0 83.3  PLT 261 250    Recent Labs  08/01/16 0014 08/01/16 1428 08/01/16 1952  TROPONINI <0.03 <0.03 <0.03   Invalid input(s): POCBNP No results for input(s): HGBA1C in the last 72 hours.   Weights: Filed Weights   08/01/16 0000  Weight: 88.9 kg (196 lb)     Radiology/Studies:  Dg Chest 2 View  Addendum Date: 07/07/2016   ADDENDUM REPORT: 07/07/2016 03:18 ADDENDUM: The prominent calcified density overlying the left lung base most likely reflects a large calcified granuloma. Alternatively, if it resides within the left ventricle, it could reflect calcification near the mitral annulus. Regardless, it has a benign appearance. Electronically Signed   By: Roanna Raider M.D.   On: 07/07/2016 03:18   Result Date:  07/07/2016 CLINICAL DATA:  Acute onset of generalized chest pain. Initial encounter. EXAM: CHEST  2 VIEW COMPARISON:  None. FINDINGS: The lungs are well-aerated and clear. There is no evidence of focal opacification, pleural effusion or pneumothorax. The heart is borderline enlarged. No acute osseous abnormalities are seen. IMPRESSION: Borderline cardiomegaly.  Lungs remain grossly clear. Electronically Signed: By: Roanna RaiderJeffery  Chang M.D. On: 07/07/2016 02:49   Ct Head Wo Contrast  Result Date: 08/01/2016 CLINICAL DATA:  Chest pain and reported seizure.  EXAM: CT HEAD WITHOUT CONTRAST TECHNIQUE: Contiguous axial images were obtained from the base of the skull through the vertex without intravenous contrast. COMPARISON:  None. FINDINGS: Brain: No mass lesion, intraparenchymal hemorrhage or extra-axial collection. No evidence of acute cortical infarct. Brain parenchyma and CSF-containing spaces are normal for age. Vascular: No hyperdense vessel or unexpected calcification. Skull: Normal visualized skull base, calvarium and extracranial soft tissues. Sinuses/Orbits: No sinus fluid levels or advanced mucosal thickening. No mastoid effusion. Normal orbits. IMPRESSION: Normal head CT. Electronically Signed   By: Deatra RobinsonKevin  Herman M.D.   On: 08/01/2016 03:54   Mr Laqueta JeanBrain W ZOWo Contrast  Result Date: 08/01/2016 CLINICAL DATA:  Syncopal episodes.  Possible seizure. EXAM: MRI HEAD WITHOUT AND WITH CONTRAST TECHNIQUE: Multiplanar, multiecho pulse sequences of the brain and surrounding structures were obtained without and with intravenous contrast. CONTRAST:  18mL MULTIHANCE GADOBENATE DIMEGLUMINE 529 MG/ML IV SOLN COMPARISON:  Head CT same day FINDINGS: Brain: The brain has normal appearance without evidence of malformation, atrophy, old or acute small or large vessel infarction, hemorrhage, hydrocephalus or extra-axial collection. No pituitary abnormality. After contrast administration, no abnormal enhancement occurs. Mesial temporal lobes are within normal limits. Vascular: Major vessels at the base of the brain show flow. Skull and upper cervical spine: Normal Sinuses/Orbits: Mild mucosal thickening of the maxillary sinuses. Orbits negative. Other: None significant. IMPRESSION: Normal examination. No abnormality seen to explain the clinical presentation. Electronically Signed   By: Paulina FusiMark  Shogry M.D.   On: 08/01/2016 12:25   Dg Chest Portable 1 View  Result Date: 08/01/2016 CLINICAL DATA:  Left upper chest pain EXAM: PORTABLE CHEST 1 VIEW COMPARISON:  07/07/2016 FINDINGS:  There are low lung volumes. No acute consolidation or effusion. Mild cardiomegaly without overt failure. Oval calcification at the left lung base is unchanged. No pneumothorax IMPRESSION: 1. Low lung volumes without acute infiltrate 2. Mild cardiomegaly without overt failure Electronically Signed   By: Jasmine PangKim  Fujinaga M.D.   On: 08/01/2016 01:31   Ct Angio Chest Aorta W And/or Wo Contrast  Result Date: 08/01/2016 CLINICAL DATA:  Chest pain pain with breathing EXAM: CT ANGIOGRAPHY CHEST WITH CONTRAST TECHNIQUE: Multidetector CT imaging of the chest was performed using the standard protocol during bolus administration of intravenous contrast. Multiplanar CT image reconstructions and MIPs were obtained to evaluate the vascular anatomy. CONTRAST:  75 mL Isovue 370 intravenous COMPARISON:  Chest x-ray 08/01/2016 FINDINGS: Cardiovascular: There is no evidence for aortic dissection. Limited evaluation of the pulmonary artery's due to preferential opacification of the aorta. The heart appears slightly enlarged. There is a coarse calcification which appears to localize to the wall of the left ventricle. No pericardial effusion. 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 Trachea and mainstem bronchi are within normal limits. Esophagus is unremarkable. No mediastinal or hilar adenopathy. Lungs/Pleura: Lungs are clear. No pleural effusion or pneumothorax. Upper Abdomen: No acute abnormality. Musculoskeletal: No chest wall abnormality. No acute or significant osseous findings. Review of the MIP images confirms the above  findings. IMPRESSION: 1. No CT evidence for acute aortic dissection. 2. Mild cardiomegaly. Coarse calcification appears to localize to the left ventricular myocardium, this could be related to previous myocardial infarct or myocarditis. There does not appear to be aneurysmal dilatation of the left ventricle. Electronically Signed   By: Jasmine PangKim  Fujinaga M.D.   On: 08/01/2016 03:57   [place]  Abdomen Limited Ruq  Result Date: 07/07/2016 CLINICAL DATA:  Right upper quadrant pain for several days. EXAM: US ABDOMEN LIMITED - RIGHT UPPER QUADRANT COMPARISON:  None. FINDINGS: Gallbladder: No gallstones or wall thickening visualized. No sonographic Murphy sign noted by sonographer. Common bile duct: Diameter: 4 mm Liver: No focal lesion identified. Within normal limits in parenchymal echogenicity. IMPRESSION: Normal liver, gallbladder and bile ducts Electronically Signed   By: Ellery Plunkaniel R Mitchell M.D.   On: 07/07/2016 03:19     Assessment and Recommendation  40 y.o. male with the episode of syncope of unknown etiology with manifestations thereafter concerning for rhythm disturbances not seen by telemetry and or other cardiovascular concerns not revealed in last 24 hours 1. Echocardiogram pending for any cardiovascular or structural abnormality 2. Continue telemetry following for rhythm disturbances causing above 3. Ambulation and follow for any further significant rhythm disturbances or symptoms or recreation of the syncopal episode 4. Possible need for either event monitor and/or longer monitor to assess for rhythm disturbances as outpatient  Signed, Arnoldo HookerBruce Detron Carras M.D. FACC

## 2016-08-02 NOTE — Progress Notes (Signed)
Assessment and morning med pass done with Interpreter at bedside. No complaints. Will continue to monitor. Explained safety and bed alarm use. Patient agrees to call before getting up.

## 2021-04-04 ENCOUNTER — Other Ambulatory Visit: Payer: Self-pay

## 2021-04-04 ENCOUNTER — Emergency Department: Payer: Self-pay

## 2021-04-04 ENCOUNTER — Encounter: Payer: Self-pay | Admitting: Emergency Medicine

## 2021-04-04 ENCOUNTER — Emergency Department
Admission: EM | Admit: 2021-04-04 | Discharge: 2021-04-04 | Disposition: A | Discharge status: Home / Self Care | Payer: Self-pay | Attending: Emergency Medicine | Admitting: Emergency Medicine

## 2021-04-04 DIAGNOSIS — R1012 Left upper quadrant pain: Secondary | ICD-10-CM | POA: Insufficient documentation

## 2021-04-04 DIAGNOSIS — Z7982 Long term (current) use of aspirin: Secondary | ICD-10-CM | POA: Insufficient documentation

## 2021-04-04 DIAGNOSIS — F17200 Nicotine dependence, unspecified, uncomplicated: Secondary | ICD-10-CM | POA: Insufficient documentation

## 2021-04-04 DIAGNOSIS — R109 Unspecified abdominal pain: Secondary | ICD-10-CM

## 2021-04-04 LAB — URINALYSIS, COMPLETE (UACMP) WITH MICROSCOPIC
Bacteria, UA: NONE SEEN
Bilirubin Urine: NEGATIVE
Glucose, UA: NEGATIVE mg/dL
Ketones, ur: NEGATIVE mg/dL
Leukocytes,Ua: NEGATIVE
Nitrite: NEGATIVE
Protein, ur: NEGATIVE mg/dL
Specific Gravity, Urine: 1.021 (ref 1.005–1.030)
Squamous Epithelial / LPF: NONE SEEN (ref 0–5)
pH: 6 (ref 5.0–8.0)

## 2021-04-04 LAB — COMPREHENSIVE METABOLIC PANEL
ALT: 40 U/L (ref 0–44)
AST: 29 U/L (ref 15–41)
Albumin: 4.2 g/dL (ref 3.5–5.0)
Alkaline Phosphatase: 62 U/L (ref 38–126)
Anion gap: 8 (ref 5–15)
BUN: 14 mg/dL (ref 6–20)
CO2: 24 mmol/L (ref 22–32)
Calcium: 8.9 mg/dL (ref 8.9–10.3)
Chloride: 105 mmol/L (ref 98–111)
Creatinine, Ser: 0.72 mg/dL (ref 0.61–1.24)
GFR, Estimated: >60 mL/min (ref >60)
Glucose, Bld: 91 mg/dL (ref 70–99)
Potassium: 4.1 mmol/L (ref 3.5–5.1)
Sodium: 137 mmol/L (ref 135–145)
Total Bilirubin: 0.6 mg/dL (ref 0.3–1.2)
Total Protein: 7.9 g/dL (ref 6.5–8.1)

## 2021-04-04 LAB — CBC
HCT: 48.8 % (ref 39.0–52.0)
Hemoglobin: 16.4 g/dL (ref 13.0–17.0)
MCH: 29 pg (ref 26.0–34.0)
MCHC: 33.6 g/dL (ref 30.0–36.0)
MCV: 86.2 fL (ref 80.0–100.0)
Platelets: 303 10*3/uL (ref 150–400)
RBC: 5.66 MIL/uL (ref 4.22–5.81)
RDW: 13.8 % (ref 11.5–15.5)
WBC: 9.1 10*3/uL (ref 4.0–10.5)
nRBC: 0 % (ref 0.0–0.2)

## 2021-04-04 LAB — LIPASE, BLOOD: Lipase: 23 U/L (ref 11–51)

## 2021-04-04 MED ORDER — CYCLOBENZAPRINE HCL 5 MG PO TABS: 5.0000 mg | ORAL_TABLET | Freq: Three times a day (TID) | ORAL | 0 refills | Status: AC | PRN

## 2021-04-04 NOTE — ED Notes (Signed)
Trevor Schultz, video Spanish interpreter ID # [id number] used to provide discharge instructions, Dr Lenard Lance at bedside to update pt on test results.

## 2021-04-04 NOTE — ED Provider Notes (Signed)
[facility] Emergency Department Provider Note  Time seen: 12:21 PM  I have reviewed the triage vital signs and the nursing notes.   HISTORY  Chief Complaint Abdominal Pain   HPI Trevor Schultz is a 45 y.o. male with a past medical history of gastric reflux, presents to the emergency department for left-sided abdominal pain.  According to the patient for the past 3 weeks he has been experiencing pain in his left flank, states it is worse with movement or if he eats certain foods.  Denies any right-sided pain.  Denies any dysuria or hematuria.  No history of known kidney stones.  Denies any pain radiating to the leg or groin.  States the pain is mostly in the left upper quadrant radiating to his flank.  Denies any recent gastric reflux or heartburn.  Denies alcohol use.   Past Medical History:  Diagnosis Date   Anginal pain (HCC)    Dyspnea    GERD (gastroesophageal reflux disease)    Seizures (HCC)    possible,  may be the reason for admission    Patient Active Problem List   Diagnosis Date Noted   Chest pain 08/01/2016   Seizure [facility])     Past Surgical History:  Procedure Laterality Date   none      Prior to Admission medications   Medication Sig Start Date End Date Taking? Authorizing Provider  aspirin EC 81 MG EC tablet Take 1 tablet (81 mg total) by mouth daily. 08/03/16   Shaune Pollack, MD  atorvastatin (LIPITOR) 40 MG tablet Take 1 tablet (40 mg total) by mouth daily. 08/02/16   Shaune Pollack, MD  nitroGLYCERIN (NITROSTAT) 0.4 MG SL tablet Place 1 tablet (0.4 mg total) under the tongue every 5 (five) minutes x 3 doses as needed for chest pain. 08/02/16   Shaune Pollack, MD  pantoprazole (PROTONIX) 40 MG tablet Take 1 tablet (40 mg total) by mouth daily. 07/07/16 07/07/17  Darci Current, MD    No Known Allergies  No family history on file.  Social History Social History   Tobacco Use   Smoking status: Smoker, Current Status Unknown    Smokeless tobacco: Never  Substance Use Topics   Alcohol use: No   Drug use: No    Review of Systems Constitutional: Negative for fever. Cardiovascular: Negative for chest pain. Respiratory: Negative for shortness of breath. Gastrointestinal: Left flank pain.  Negative for vomiting or diarrhea. Genitourinary: Negative for urinary compaints Musculoskeletal: Negative for musculoskeletal complaints Neurological: Negative for headache All other ROS negative  ____________________________________________   PHYSICAL EXAM:  VITAL SIGNS: ED Triage Vitals  Enc Vitals Group     BP 04/04/21 1057 111/83     Pulse Rate 04/04/21 1057 67     Resp 04/04/21 1057 17     Temp 04/04/21 1057 98 F (36.7 C)     Temp Source 04/04/21 1057 Oral     SpO2 04/04/21 1057 98 %     Weight 04/04/21 1135 195 lb 15.8 oz (88.9 kg)     Height 04/04/21 1118 5\' 5"  (1.651 m)     Head Circumference --      Peak Flow --      Pain Score 04/04/21 1118 8     Pain Loc --      Pain Edu? --      Excl. in GC? --    Constitutional: Alert and oriented. Well appearing and in no distress. Eyes: Normal exam ENT  Head: Normocephalic and atraumatic.      Mouth/Throat: Mucous membranes are moist. Cardiovascular: Normal rate, regular rhythm.  Respiratory: Normal respiratory effort without tachypnea nor retractions. Breath sounds are clear Gastrointestinal: Soft, mild left-sided abdominal tenderness palpation with mild to moderate left CVA tenderness.  No rebound guarding or distention. Musculoskeletal: Nontender with normal range of motion in all extremities.  Neurologic:  Normal speech and language. No gross focal neurologic deficits Skin:  Skin is warm, dry and intact.  Psychiatric: Mood and affect are normal.  ____________________________________________    EKG  EKG viewed and interpreted by myself shows normal sinus rhythm at 65 bpm with a narrow QRS, normal axis, normal intervals, no concerning ST  changes.  ____________________________________________    RADIOLOGY  CT scan is negative for acute abnormality  ____________________________________________   INITIAL IMPRESSION / ASSESSMENT AND PLAN / ED COURSE  Pertinent labs & imaging results that were available during my care of the patient were reviewed by me and considered in my medical decision making (see chart for details).   Patient presents emergency department for left flank pain ongoing over the past 3 weeks.  Patient states it is worse with movement.  Denies any dysuria or hematuria.  Differential would include ureterolithiasis, UTI or pyelonephritis, gastritis.  Patient's work-up thus far is reassuring including CBC, CMP and lipase.  Urinalysis is pending.  We will proceed with a CT scan of the abdomen/pelvis to evaluate for possible ureterolithiasis.  Given the pain worse with movement this could also very likely be muscular discomfort.  CT scan negative for acute abnormality.  Suspect possible muscular discomfort.  We will discharge with Flexeril and PCP follow-up.  Patient agreeable to plan of care.  Trevor Schultz was evaluated in Emergency Department on 04/04/2021 for the symptoms described in the history of present illness. He was evaluated in the context of the global COVID-19 pandemic, which necessitated consideration that the patient might be at risk for infection with the SARS-CoV-2 virus that causes COVID-19. Institutional protocols and algorithms that pertain to the evaluation of patients at risk for COVID-19 are in a state of rapid change based on information released by regulatory bodies including the CDC and federal and state organizations. These policies and algorithms were followed during the patient's care in the ED.  ____________________________________________   FINAL CLINICAL IMPRESSION(S) / ED DIAGNOSES  Left flank pain   Minna Antis, MD 04/04/21 1344

## 2021-04-04 NOTE — ED Notes (Signed)
Video spanish interpreter used, nameTonna [place], [place] # [id number].

## 2021-04-04 NOTE — ED Triage Notes (Signed)
Triage completed using spanish interpreter [place] [phone]  Pt to ER via POV with complaints of left sided flank pain that radiates into abdomen, states has been dealing with this for 22 days. States today he couldn't take the pain any longer. Pain is intermittent. No urinary symptoms. Denies hx of kidney stones. Denies NVD.

## 2021-11-15 IMAGING — CT CT RENAL STONE PROTOCOL
3 of 4 series · 8 of 46 positions shown, 15 images · non-contrast
Comparison: CT chest 07/24/2016.

CLINICAL DATA: Left flank pain for 22 days.

EXAM:
CT ABDOMEN AND PELVIS WITHOUT CONTRAST
TECHNIQUE: Multidetector CT imaging of the abdomen and pelvis was performed
following the standard protocol without IV contrast.

[Series 4: lung bases · axial · 0.82mm/px · z∈[-510,-435]mm · 4 of 25 slices shown, 9 images]
[im 5/25  soft-tissue]
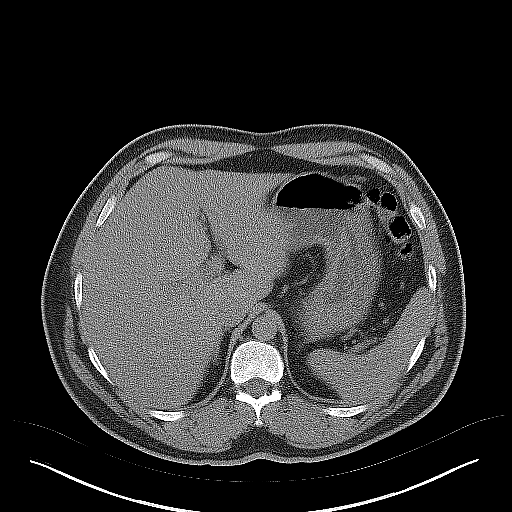
[im 5/25  lung]
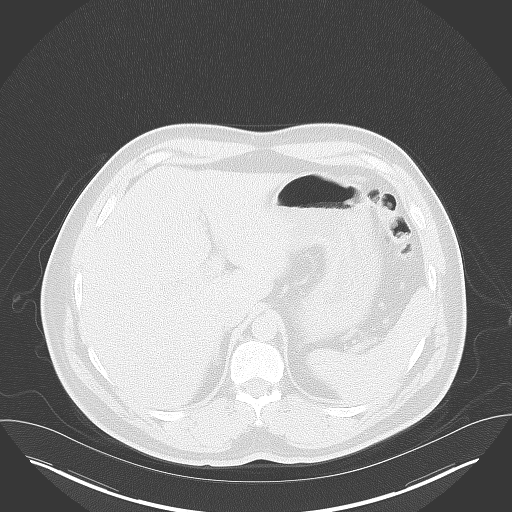
[im 5/25  bone]
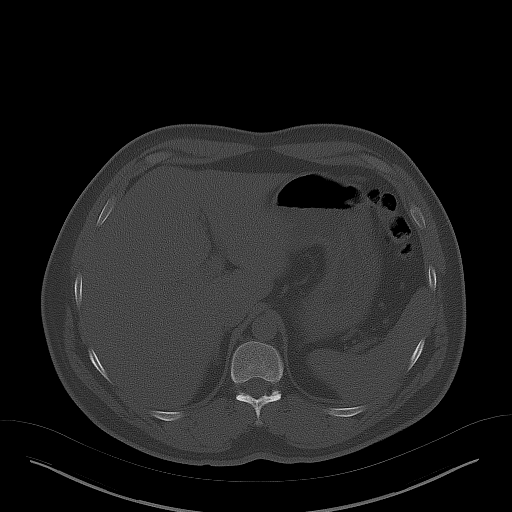
[im 10/25  soft-tissue]
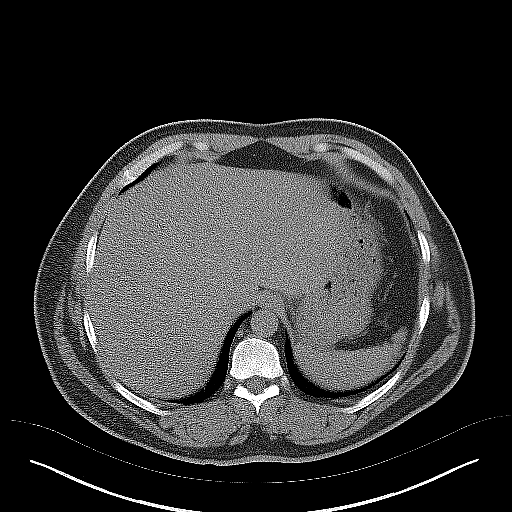
[im 10/25  lung]
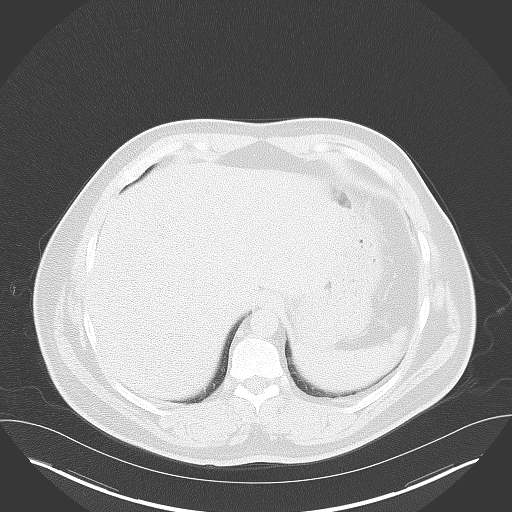
[im 15/25  soft-tissue]
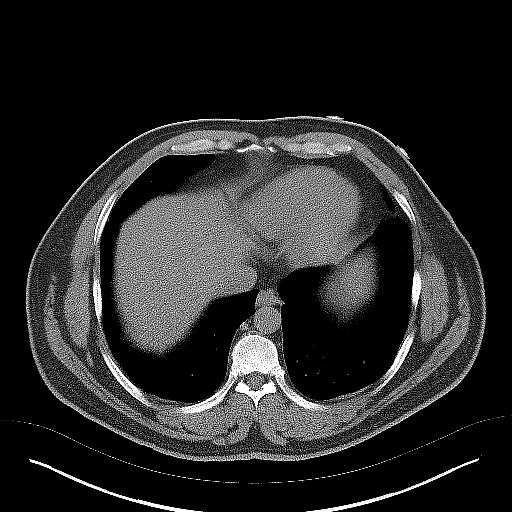
[im 15/25  lung]
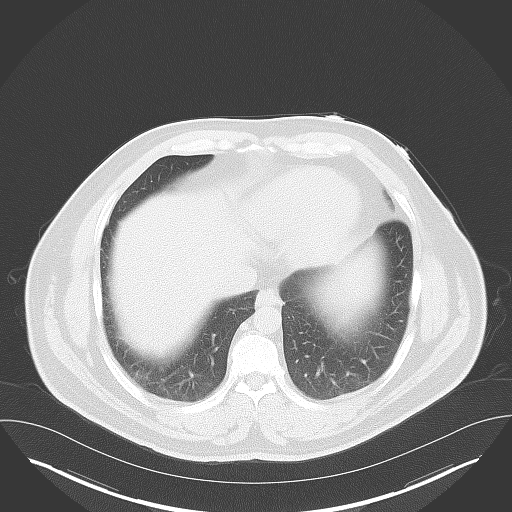
[im 20/25  soft-tissue]
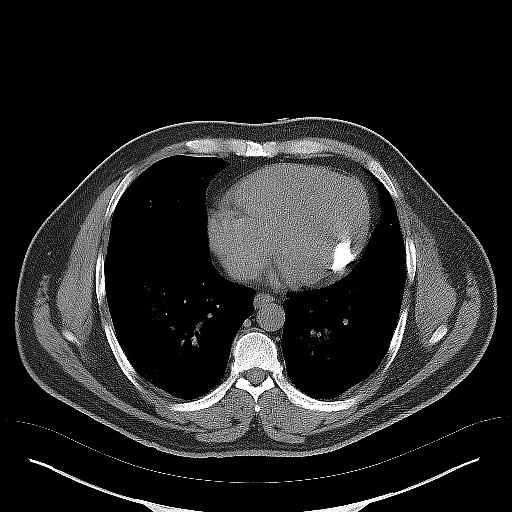
[im 20/25  lung]
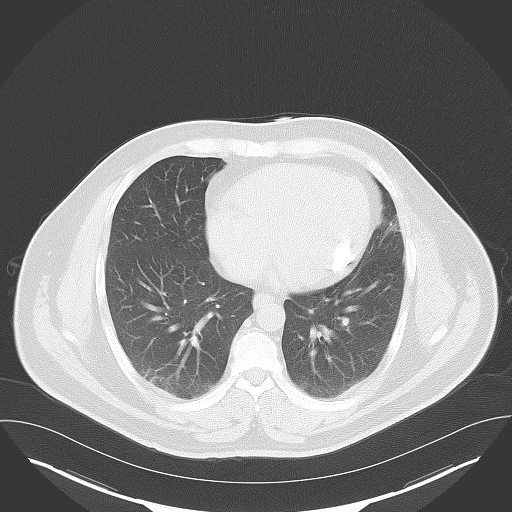

[Series 5: coronal · coronal · 0.80mm/px · 3 of 153 slices shown, 4 images]
[im 51/153  soft-tissue]
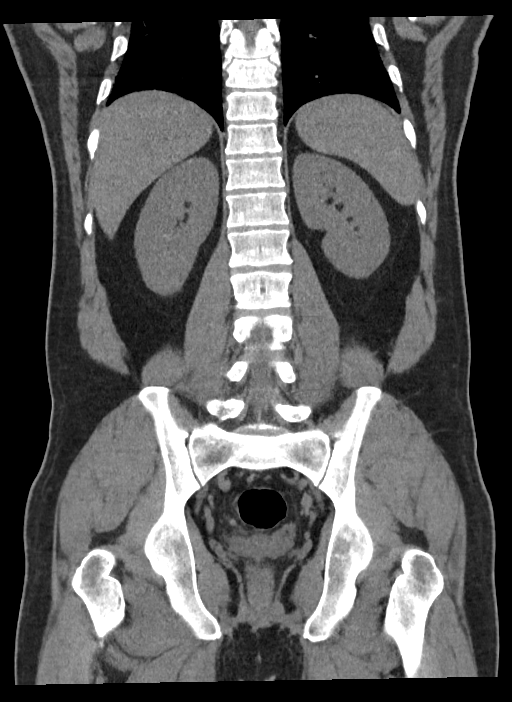
[im 68/153  soft-tissue]
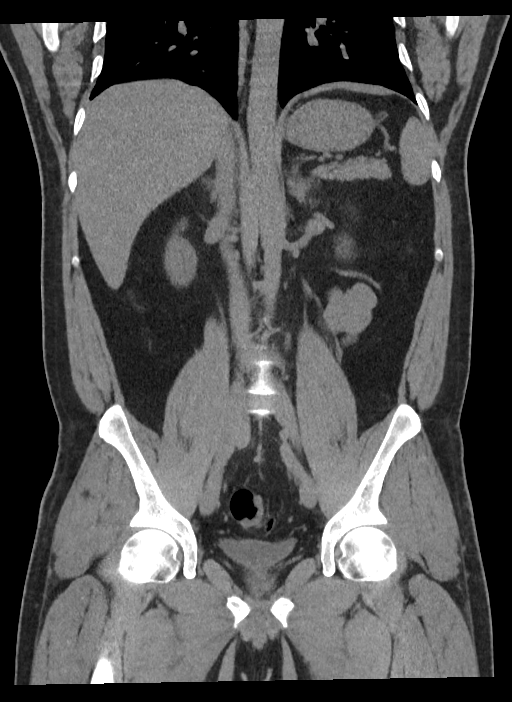
[im 68/153  bone]
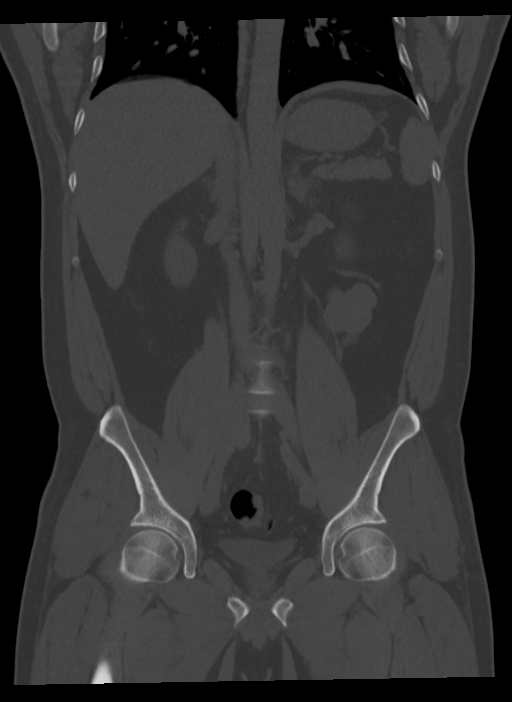
[im 85/153  soft-tissue]
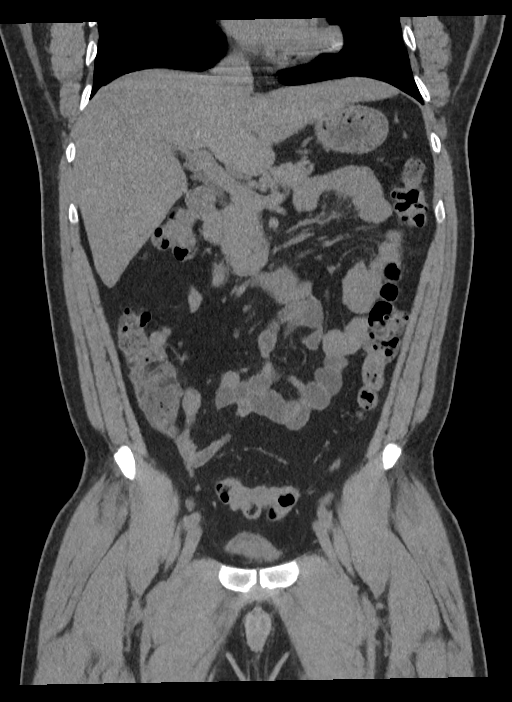

[Series 6: sagittal · sagittal · 0.65mm/px · 1 of 196 slices shown, 2 images]
[im 66/196  soft-tissue]
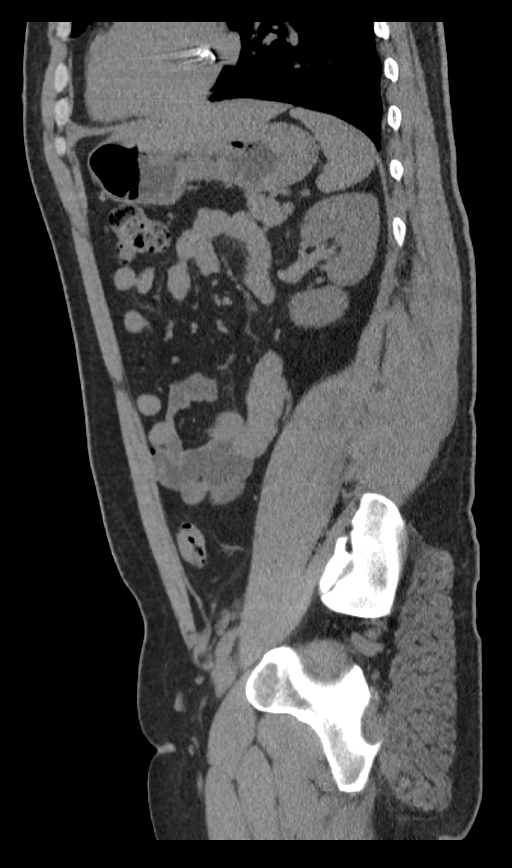
[im 66/196  bone]
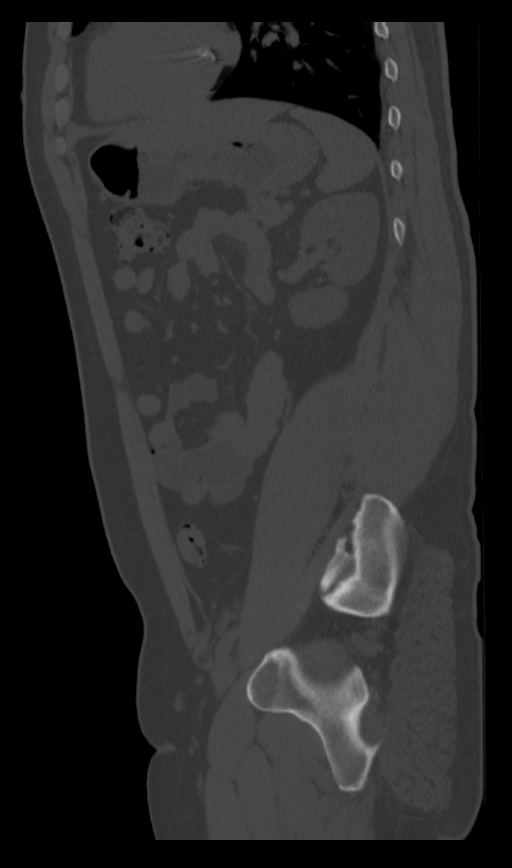

[8 of 46 positions shown; findings below may reference images not displayed]

FINDINGS: Lower chest: Lung bases clear. No pleural or pericardial effusion.
Coarse calcification in the lateral wall of the left ventricle is
unchanged.

Hepatobiliary: No focal liver abnormality is seen. No gallstones,
gallbladder wall thickening, or biliary dilatation. Fatty
infiltration noted.

Pancreas: Unremarkable. No pancreatic ductal dilatation or
surrounding inflammatory changes.

Spleen: Normal in size without focal abnormality.

Adrenals/Urinary Tract: Adrenal glands are unremarkable. Kidneys are
normal, without renal calculi, focal lesion, or hydronephrosis.
Bladder is unremarkable.

Stomach/Bowel: Stomach is within normal limits. Appendix appears
normal. No evidence of bowel wall thickening, distention, or
inflammatory changes.

Vascular/Lymphatic: No significant vascular findings are present. No
enlarged abdominal or pelvic lymph nodes.

Reproductive: Prostate is unremarkable.

Other: None.

Musculoskeletal: Negative.
IMPRESSION: Negative for urinary tract stone. No acute abnormality or finding to
explain the patient's symptoms.

Fatty infiltration of the liver.

## 2022-09-15 ENCOUNTER — Emergency Department
Admission: EM | Admit: 2022-09-15 | Discharge: 2022-09-15 | Disposition: A | Discharge status: Home / Self Care | Payer: Self-pay | Attending: Emergency Medicine | Admitting: Emergency Medicine

## 2022-09-15 ENCOUNTER — Other Ambulatory Visit: Payer: Self-pay

## 2022-09-15 ENCOUNTER — Emergency Department: Payer: Self-pay

## 2022-09-15 DIAGNOSIS — R109 Unspecified abdominal pain: Secondary | ICD-10-CM | POA: Insufficient documentation

## 2022-09-15 DIAGNOSIS — N50811 Right testicular pain: Secondary | ICD-10-CM | POA: Insufficient documentation

## 2022-09-15 DIAGNOSIS — K219 Gastro-esophageal reflux disease without esophagitis: Secondary | ICD-10-CM | POA: Insufficient documentation

## 2022-09-15 LAB — COMPREHENSIVE METABOLIC PANEL
ALT: 51 U/L — ABNORMAL HIGH (ref 0–44)
AST: 32 U/L (ref 15–41)
Albumin: 4 g/dL (ref 3.5–5.0)
Alkaline Phosphatase: 76 U/L (ref 38–126)
Anion gap: 8 (ref 5–15)
BUN: 10 mg/dL (ref 6–20)
CO2: 24 mmol/L (ref 22–32)
Calcium: 9.1 mg/dL (ref 8.9–10.3)
Chloride: 106 mmol/L (ref 98–111)
Creatinine, Ser: 0.62 mg/dL (ref 0.61–1.24)
GFR, Estimated: >60 mL/min (ref >60)
Glucose, Bld: 87 mg/dL (ref 70–99)
Potassium: 4.2 mmol/L (ref 3.5–5.1)
Sodium: 138 mmol/L (ref 135–145)
Total Bilirubin: 0.5 mg/dL (ref 0.3–1.2)
Total Protein: 8.3 g/dL — ABNORMAL HIGH (ref 6.5–8.1)

## 2022-09-15 LAB — CBC WITH DIFFERENTIAL/PLATELET
Abs Immature Granulocytes: 0.06 10*3/uL (ref 0.00–0.07)
Basophils Absolute: 0.1 10*3/uL (ref 0.0–0.1)
Basophils Relative: 1 %
Eosinophils Absolute: 0.1 10*3/uL (ref 0.0–0.5)
Eosinophils Relative: 1 %
HCT: 48 % (ref 39.0–52.0)
Hemoglobin: 15.2 g/dL (ref 13.0–17.0)
Immature Granulocytes: 0 %
Lymphocytes Relative: 24 %
Lymphs Abs: 3.5 10*3/uL (ref 0.7–4.0)
MCH: 27.9 pg (ref 26.0–34.0)
MCHC: 31.7 g/dL (ref 30.0–36.0)
MCV: 88.2 fL (ref 80.0–100.0)
Monocytes Absolute: 1 10*3/uL (ref 0.1–1.0)
Monocytes Relative: 7 %
Neutro Abs: 9.8 10*3/uL — ABNORMAL HIGH (ref 1.7–7.7)
Neutrophils Relative %: 67 %
Platelets: 341 10*3/uL (ref 150–400)
RBC: 5.44 MIL/uL (ref 4.22–5.81)
RDW: 13.9 % (ref 11.5–15.5)
WBC: 14.5 10*3/uL — ABNORMAL HIGH (ref 4.0–10.5)
nRBC: 0 % (ref 0.0–0.2)

## 2022-09-15 LAB — CHLAMYDIA/NGC RT PCR (ARMC ONLY)
Chlamydia Tr: NOT DETECTED
N gonorrhoeae: NOT DETECTED

## 2022-09-15 LAB — URINALYSIS, ROUTINE W REFLEX MICROSCOPIC
Bacteria, UA: NONE SEEN
Bilirubin Urine: NEGATIVE
Glucose, UA: NEGATIVE mg/dL
Ketones, ur: 5 mg/dL — AB
Leukocytes,Ua: NEGATIVE
Nitrite: NEGATIVE
Protein, ur: NEGATIVE mg/dL
Specific Gravity, Urine: 1.027 (ref 1.005–1.030)
Squamous Epithelial / HPF: NONE SEEN /HPF (ref 0–5)
pH: 5 (ref 5.0–8.0)

## 2022-09-15 MED ORDER — MELOXICAM 15 MG PO TABS: 15.0000 mg | ORAL_TABLET | Freq: Every day | ORAL | 11 refills | Status: AC

## 2022-09-15 NOTE — ED Provider Notes (Signed)
[facility] Provider Note    Event Date/Time   First MD Initiated Contact with Patient 09/15/22 1134     (approximate)   History   Chief Complaint Testicle Pain   HPI Trevor Schultz is a 47 y.o. male, history of seizures, GERD, presents to the emergency department for evaluation of right-sided testicular pain/flank pain.  He states that this been going on for the past 5 days.  Denies any recent falls or injuries.  Denies fever/chills, chest pain, shortness of breath, abdominal pain, nausea/vomiting, diarrhea, hematuria, hematochezia, urinary symptoms, or paresthesias.  He states that he is not concerned for STDs, as he is in a monogamous relationship with his wife.  History Limitations: Spanish-speaking.        Physical Exam  Triage Vital Signs: ED Triage Vitals [09/15/22 1113]  Enc Vitals Group     BP (!) 134/94     Pulse Rate 75     Resp 17     Temp 98 F (36.7 C)     Temp Source Oral     SpO2 96 %     Weight      Height      Head Circumference      Peak Flow      Pain Score      Pain Loc      Pain Edu?      Excl. in [place]?     Most recent vital signs: Vitals:   09/15/22 1113 09/15/22 1433  BP: (!) 134/94 122/89  Pulse: 75 70  Resp: 17 16  Temp: 98 F (36.7 C)   SpO2: 96% 100%    General: Awake, NAD.  Skin: Warm, dry. No rashes or lesions.  Eyes: PERRL. Conjunctivae normal.  CV: Good peripheral perfusion.  Resp: Normal effort.  Abd: Soft, non-tender. No distention.  Negative CVAT. Neuro: At baseline. No gross neurological deficits.  Musculoskeletal: Normal ROM of all extremities.  Focused Exam: No gross deformities to the testicles.  No remarkable erythema or swelling.  He does appear to have some tenderness with palpation of the right testicle.  No urethral discharge.  No rashes or lesions.  Physical Exam    ED Results / Procedures / Treatments  Labs (all labs ordered are listed, but only abnormal results are  displayed) Labs Reviewed  URINALYSIS, ROUTINE W REFLEX MICROSCOPIC - Abnormal; Notable for the following components:      Result Value   Color, Urine YELLOW (*)    APPearance CLEAR (*)    Hgb urine dipstick SMALL (*)    Ketones, ur 5 (*)    All other components within normal limits  CBC WITH DIFFERENTIAL/PLATELET - Abnormal; Notable for the following components:   WBC 14.5 (*)    Neutro Abs 9.8 (*)    All other components within normal limits  COMPREHENSIVE METABOLIC PANEL - Abnormal; Notable for the following components:   Total Protein 8.3 (*)    ALT 51 (*)    All other components within normal limits  CHLAMYDIA/NGC RT PCR ([facility] ONLY)               EKG N/A.    RADIOLOGY  ED Provider Interpretation: I personally viewed and interpreted these images, CT renal stone study shows no evidence of ureterolithiasis or pyelonephritis.  Ultrasound shows no evidence of testicular torsion.  US SCROTUM W/DOPPLER  Result Date: 09/15/2022 CLINICAL DATA:  Right testicular pain. EXAM: SCROTAL ULTRASOUND DOPPLER ULTRASOUND OF THE TESTICLES TECHNIQUE: Complete  ultrasound examination of the testicles, epididymis, and other scrotal structures was performed. Color and spectral Doppler ultrasound were also utilized to evaluate blood flow to the testicles. COMPARISON:  CT of the abdomen and pelvis 09/15/2022 FINDINGS: Right testicle Measurements: 4.2 x 2.4 x 3.3 cm, within normal limits. No mass or microlithiasis visualized. Left testicle Measurements: 3.5 x 1.6 x 2.5 cm, within normal limits. No mass or microlithiasis visualized. Right epididymis: A 3 mm epididymal cyst is present. No inflammatory changes are present. Left epididymis: A 4 mm epididymal cyst is present. No inflammatory changes are present. Hydrocele:  Small bilateral simple hydroceles are present. Varicocele:  None visualized. Pulsed Doppler interrogation of both testes demonstrates normal low resistance arterial and venous waveforms  bilaterally. IMPRESSION: 1. Normal sonographic appearance of the testicles bilaterally. 2. Small bilateral epididymal cysts are present. 3. Small bilateral simple hydroceles are present. Electronically Signed   By: San Morelle M.D.   On: 09/15/2022 15:24   CT Renal Stone Study  Result Date: 09/15/2022 CLINICAL DATA:  Right flank pain EXAM: CT ABDOMEN AND PELVIS WITHOUT CONTRAST TECHNIQUE: Multidetector CT imaging of the abdomen and pelvis was performed following the standard protocol without IV contrast. RADIATION DOSE REDUCTION: This exam was performed according to the departmental dose-optimization program which includes automated exposure control, adjustment of the mA and/or kV according to patient size and/or use of iterative reconstruction technique. COMPARISON:  04/04/2021, 08/01/2016 FINDINGS: Lower chest: Large coarse calcification along the lateral wall of the left ventricle is grossly unchanged since at least 2017. Included lung bases are clear. Hepatobiliary: Unremarkable unenhanced appearance of the liver. No focal liver lesion identified. Gallbladder within normal limits. No hyperdense gallstone. No biliary dilatation. Pancreas: Unremarkable. No pancreatic ductal dilatation or surrounding inflammatory changes. Spleen: Normal in size without focal abnormality. Adrenals/Urinary Tract: Adrenal glands are unremarkable. Kidneys are normal, without renal calculi, focal lesion, or hydronephrosis. No ureteral calculi. Bladder is unremarkable. Stomach/Bowel: Stomach is within normal limits. Appendix appears normal. No evidence of bowel wall thickening, distention, or inflammatory changes. Vascular/Lymphatic: No significant vascular findings are present. No enlarged abdominal or pelvic lymph nodes. Reproductive: Prostate is unremarkable. Other: No free fluid. No abdominopelvic fluid collection. No pneumoperitoneum. No abdominal wall hernia. Musculoskeletal: No acute or significant osseous findings.  IMPRESSION: 1. No acute abdominopelvic findings. Specifically, no evidence of obstructive uropathy. 2. Large coarse calcification along the lateral wall of the left ventricle is grossly unchanged since at least 2017. Electronically Signed   By: Davina Poke D.O.   On: 09/15/2022 13:16    PROCEDURES:  Critical Care performed: N/A.  Procedures    MEDICATIONS ORDERED IN ED: Medications - No data to display   IMPRESSION / MDM / [place] / ED COURSE  I reviewed the triage vital signs and the nursing notes.                              Differential diagnosis includes, but is not limited to, cystitis, pyelonephritis, ureterolithiasis, testicular torsion, epididymitis, hydrocele, varicocele, hydronephrosis  ED Course Patient appears well, vitals within normal limits.  NAD.  CBC shows elevated WBC at 14.5.  No anemia.  CMP shows no electrolyte abnormalities, AKI, or transaminitis.  Urinalysis shows small amounts of hemoglobin, otherwise no evidence of urinary tract infection.  Chlamydia/gonorrhea testing negative.  Assessment/Plan Presents with right-sided flank pain with radiation into the right testicle.  He is not experiencing any urinary symptoms.  Physical exam  is overall unremarkable with the exception of slight tenderness with manipulation of the right testicle.  CT renal stone study does not show any evidence of ureterolithiasis or pyelonephritis.  Urinalysis shows no evidence of infection.  Scrotal ultrasound fortunately does not show any evidence of testicular torsion, though does show some bilateral hydroceles which may be contributing to his discomfort.  However, there does not appear to be anything serious or life-threatening at this time.  Also possibility for pelvic strain.  Will provide him with a prescription for meloxicam as needed for pain.  However, did advise him that if his testicular pain continues, that he may follow-up with urology.  He was amenable to  this.  Will discharge.  Considered admission for this patient, but given his stable presentation and unremarkable workup, is unlikely benefit from admission.  Provided the patient with anticipatory guidance, return precautions, and educational material. Encouraged the patient to return to the emergency department at any time if they begin to experience any new or worsening symptoms. Patient expressed understanding and agreed with the plan.   Patient's presentation is most consistent with acute complicated illness / injury requiring diagnostic workup.       FINAL CLINICAL IMPRESSION(S) / ED DIAGNOSES   Final diagnoses:  Testicular pain, right     Rx / DC Orders   ED Discharge Orders          Ordered    meloxicam (MOBIC) 15 MG tablet  Daily        09/15/22 1601             Note:  This document was prepared using Dragon voice recognition software and may include unintentional dictation errors.   Varney Daily, [place] 09/15/22 1622    Jene Every, MD 09/18/22 1640

## 2022-09-15 NOTE — Discharge Instructions (Addendum)
-  Your CT scan and ultrasound not show any signs of any serious or life-threatening conditions.  The ultrasound did show some hydroceles/epididymal cysts on both sides of your scrotum which are typically benign.  If you continue to have testicular pain though despite 1 to 2 weeks of treatment with meloxicam, you may follow-up with the urologist listed in these instructions.  However, suspect this is likely a musculoskeletal strain and will heal within the next few weeks.  -Return to the emergency department anytime if you begin to experience any new or worsening symptoms.

## 2022-09-15 NOTE — ED Notes (Signed)
Will defer physical assessment to PA.

## 2022-09-15 NOTE — ED Triage Notes (Addendum)
Pt presents to ED with c/o of testicle pain and swelling to the R side. Pt states pain started Monday. Pt denies urinary s/s. Pt denies STI's.   Mobile interpreter used in triage

## 2022-09-15 NOTE — ED Notes (Signed)
Patient taken to CT scan.
